# Patient Record
Sex: Male | Born: 1950
Health system: Southern US, Community
[De-identification: ages and names within clinical notes are randomized; demographics above are authoritative.]

## PROBLEM LIST (undated history)

## (undated) DIAGNOSIS — E041 Nontoxic single thyroid nodule: Secondary | ICD-10-CM

## (undated) DIAGNOSIS — K219 Gastro-esophageal reflux disease without esophagitis: Secondary | ICD-10-CM

## (undated) HISTORY — DX: Nontoxic single thyroid nodule: E04.1

## (undated) HISTORY — DX: Gastro-esophageal reflux disease without esophagitis: K21.9

---

## 2002-02-27 ENCOUNTER — Encounter: Payer: Self-pay | Admitting: Internal Medicine

## 2004-03-04 ENCOUNTER — Ambulatory Visit: Payer: Self-pay | Admitting: Internal Medicine

## 2005-07-25 DIAGNOSIS — R7303 Prediabetes: Secondary | ICD-10-CM

## 2005-08-09 ENCOUNTER — Ambulatory Visit: Payer: Self-pay | Admitting: Internal Medicine

## 2005-08-10 ENCOUNTER — Ambulatory Visit: Payer: Self-pay | Admitting: Internal Medicine

## 2005-10-12 ENCOUNTER — Ambulatory Visit: Payer: Self-pay | Admitting: Family Medicine

## 2006-06-18 ENCOUNTER — Other Ambulatory Visit: Payer: Self-pay

## 2006-06-18 ENCOUNTER — Emergency Department: Payer: Self-pay | Admitting: Emergency Medicine

## 2006-06-20 ENCOUNTER — Ambulatory Visit: Payer: Self-pay | Admitting: Internal Medicine

## 2007-02-03 ENCOUNTER — Telehealth: Payer: Self-pay | Admitting: Family Medicine

## 2007-03-03 ENCOUNTER — Ambulatory Visit: Payer: Self-pay | Admitting: Internal Medicine

## 2007-04-03 ENCOUNTER — Encounter: Payer: Self-pay | Admitting: Internal Medicine

## 2007-04-03 DIAGNOSIS — K449 Diaphragmatic hernia without obstruction or gangrene: Secondary | ICD-10-CM | POA: Insufficient documentation

## 2007-04-03 DIAGNOSIS — E785 Hyperlipidemia, unspecified: Secondary | ICD-10-CM | POA: Insufficient documentation

## 2007-04-13 ENCOUNTER — Ambulatory Visit: Payer: Self-pay | Admitting: Internal Medicine

## 2007-04-14 LAB — CONVERTED CEMR LAB
Cholesterol: 251 mg/dL (ref 0–200)
HDL: 41.1 mg/dL (ref >39.0)
Total CHOL/HDL Ratio: 6.1
Triglycerides: 183 mg/dL — ABNORMAL HIGH (ref 0–149)

## 2007-05-08 ENCOUNTER — Telehealth (INDEPENDENT_AMBULATORY_CARE_PROVIDER_SITE_OTHER): Payer: Self-pay | Admitting: *Deleted

## 2008-01-31 ENCOUNTER — Encounter (INDEPENDENT_AMBULATORY_CARE_PROVIDER_SITE_OTHER): Payer: Self-pay | Admitting: *Deleted

## 2008-06-24 ENCOUNTER — Ambulatory Visit: Payer: Self-pay | Admitting: Internal Medicine

## 2008-09-26 ENCOUNTER — Ambulatory Visit: Payer: Self-pay | Admitting: Internal Medicine

## 2008-09-27 LAB — CONVERTED CEMR LAB: PSA: 2.55 ng/mL (ref 0.10–4.00)

## 2008-09-30 LAB — CONVERTED CEMR LAB
Albumin: 4.4 g/dL (ref 3.5–5.2)
CO2: 30 meq/L (ref 19–32)
Calcium: 9.4 mg/dL (ref 8.4–10.5)
Chloride: 105 meq/L (ref 96–112)
Direct LDL: 162.3 mg/dL
HDL: 40.5 mg/dL (ref >39.00)
Potassium: 3.8 meq/L (ref 3.5–5.1)
TSH: 0.59 microintl units/mL (ref 0.35–5.50)
Total Bilirubin: 1.2 mg/dL (ref 0.3–1.2)
Total CHOL/HDL Ratio: 6
Triglycerides: 208 mg/dL — ABNORMAL HIGH (ref 0.0–149.0)

## 2008-10-01 ENCOUNTER — Ambulatory Visit: Payer: Self-pay | Admitting: Internal Medicine

## 2010-05-24 LAB — CONVERTED CEMR LAB: PSA: 1.54 ng/mL

## 2011-04-27 HISTORY — PX: CHOLECYSTECTOMY: SHX55

## 2011-11-05 ENCOUNTER — Ambulatory Visit: Payer: Self-pay | Admitting: Surgery

## 2012-04-03 ENCOUNTER — Ambulatory Visit: Payer: Self-pay | Admitting: Gastroenterology

## 2013-02-22 ENCOUNTER — Emergency Department: Payer: Self-pay | Admitting: Emergency Medicine

## 2013-02-22 LAB — SEDIMENTATION RATE: Erythrocyte Sed Rate: 2 mm/hr (ref 0–20)

## 2013-02-22 LAB — COMPREHENSIVE METABOLIC PANEL
Albumin: 4.1 g/dL (ref 3.4–5.0)
Alkaline Phosphatase: 121 U/L (ref 50–136)
Calcium, Total: 9.5 mg/dL (ref 8.5–10.1)
Chloride: 105 mmol/L (ref 98–107)
Co2: 29 mmol/L (ref 21–32)
Creatinine: 0.95 mg/dL (ref 0.60–1.30)
EGFR (African American): >60
SGOT(AST): 21 U/L (ref 15–37)
Sodium: 138 mmol/L (ref 136–145)
Total Protein: 7.6 g/dL (ref 6.4–8.2)

## 2013-02-22 LAB — CBC
HGB: 15.7 g/dL (ref 13.0–18.0)
MCH: 30.4 pg (ref 26.0–34.0)
MCHC: 34.5 g/dL (ref 32.0–36.0)
Platelet: 293 10*3/uL (ref 150–440)

## 2013-02-26 ENCOUNTER — Ambulatory Visit: Payer: Self-pay | Admitting: Ophthalmology

## 2013-05-31 DIAGNOSIS — M5430 Sciatica, unspecified side: Secondary | ICD-10-CM | POA: Diagnosis not present

## 2013-05-31 DIAGNOSIS — IMO0002 Reserved for concepts with insufficient information to code with codable children: Secondary | ICD-10-CM | POA: Diagnosis not present

## 2013-05-31 DIAGNOSIS — M999 Biomechanical lesion, unspecified: Secondary | ICD-10-CM | POA: Diagnosis not present

## 2013-05-31 DIAGNOSIS — M549 Dorsalgia, unspecified: Secondary | ICD-10-CM | POA: Diagnosis not present

## 2013-05-31 DIAGNOSIS — M955 Acquired deformity of pelvis: Secondary | ICD-10-CM | POA: Diagnosis not present

## 2013-05-31 DIAGNOSIS — M62838 Other muscle spasm: Secondary | ICD-10-CM | POA: Diagnosis not present

## 2013-07-26 DIAGNOSIS — M5137 Other intervertebral disc degeneration, lumbosacral region: Secondary | ICD-10-CM | POA: Diagnosis not present

## 2013-07-26 DIAGNOSIS — M999 Biomechanical lesion, unspecified: Secondary | ICD-10-CM | POA: Diagnosis not present

## 2013-07-26 DIAGNOSIS — M9981 Other biomechanical lesions of cervical region: Secondary | ICD-10-CM | POA: Diagnosis not present

## 2013-07-26 DIAGNOSIS — M530 Cervicocranial syndrome: Secondary | ICD-10-CM | POA: Diagnosis not present

## 2013-07-26 DIAGNOSIS — IMO0002 Reserved for concepts with insufficient information to code with codable children: Secondary | ICD-10-CM | POA: Diagnosis not present

## 2013-09-27 DIAGNOSIS — M5137 Other intervertebral disc degeneration, lumbosacral region: Secondary | ICD-10-CM | POA: Diagnosis not present

## 2013-09-27 DIAGNOSIS — M530 Cervicocranial syndrome: Secondary | ICD-10-CM | POA: Diagnosis not present

## 2013-09-27 DIAGNOSIS — M999 Biomechanical lesion, unspecified: Secondary | ICD-10-CM | POA: Diagnosis not present

## 2013-09-27 DIAGNOSIS — IMO0002 Reserved for concepts with insufficient information to code with codable children: Secondary | ICD-10-CM | POA: Diagnosis not present

## 2013-09-27 DIAGNOSIS — M9981 Other biomechanical lesions of cervical region: Secondary | ICD-10-CM | POA: Diagnosis not present

## 2013-10-03 DIAGNOSIS — C4441 Basal cell carcinoma of skin of scalp and neck: Secondary | ICD-10-CM | POA: Diagnosis not present

## 2013-10-22 DIAGNOSIS — L821 Other seborrheic keratosis: Secondary | ICD-10-CM | POA: Diagnosis not present

## 2013-10-22 DIAGNOSIS — D485 Neoplasm of uncertain behavior of skin: Secondary | ICD-10-CM | POA: Diagnosis not present

## 2013-10-22 DIAGNOSIS — L570 Actinic keratosis: Secondary | ICD-10-CM | POA: Diagnosis not present

## 2013-10-22 DIAGNOSIS — C4441 Basal cell carcinoma of skin of scalp and neck: Secondary | ICD-10-CM | POA: Diagnosis not present

## 2013-11-29 DIAGNOSIS — IMO0002 Reserved for concepts with insufficient information to code with codable children: Secondary | ICD-10-CM | POA: Diagnosis not present

## 2013-11-29 DIAGNOSIS — M999 Biomechanical lesion, unspecified: Secondary | ICD-10-CM | POA: Diagnosis not present

## 2013-11-29 DIAGNOSIS — M9981 Other biomechanical lesions of cervical region: Secondary | ICD-10-CM | POA: Diagnosis not present

## 2013-11-29 DIAGNOSIS — M530 Cervicocranial syndrome: Secondary | ICD-10-CM | POA: Diagnosis not present

## 2013-11-29 DIAGNOSIS — M5137 Other intervertebral disc degeneration, lumbosacral region: Secondary | ICD-10-CM | POA: Diagnosis not present

## 2013-12-03 DIAGNOSIS — C4441 Basal cell carcinoma of skin of scalp and neck: Secondary | ICD-10-CM | POA: Diagnosis not present

## 2014-01-24 DIAGNOSIS — M9901 Segmental and somatic dysfunction of cervical region: Secondary | ICD-10-CM | POA: Diagnosis not present

## 2014-01-24 DIAGNOSIS — M5117 Intervertebral disc disorders with radiculopathy, lumbosacral region: Secondary | ICD-10-CM | POA: Diagnosis not present

## 2014-01-24 DIAGNOSIS — M530 Cervicocranial syndrome: Secondary | ICD-10-CM | POA: Diagnosis not present

## 2014-01-24 DIAGNOSIS — M9903 Segmental and somatic dysfunction of lumbar region: Secondary | ICD-10-CM | POA: Diagnosis not present

## 2014-01-24 DIAGNOSIS — M9905 Segmental and somatic dysfunction of pelvic region: Secondary | ICD-10-CM | POA: Diagnosis not present

## 2014-01-24 DIAGNOSIS — Z23 Encounter for immunization: Secondary | ICD-10-CM | POA: Diagnosis not present

## 2014-01-30 DIAGNOSIS — E041 Nontoxic single thyroid nodule: Secondary | ICD-10-CM | POA: Diagnosis not present

## 2014-02-06 DIAGNOSIS — E041 Nontoxic single thyroid nodule: Secondary | ICD-10-CM | POA: Diagnosis not present

## 2014-02-07 DIAGNOSIS — E041 Nontoxic single thyroid nodule: Secondary | ICD-10-CM | POA: Diagnosis not present

## 2014-03-28 DIAGNOSIS — M5117 Intervertebral disc disorders with radiculopathy, lumbosacral region: Secondary | ICD-10-CM | POA: Diagnosis not present

## 2014-03-28 DIAGNOSIS — M9903 Segmental and somatic dysfunction of lumbar region: Secondary | ICD-10-CM | POA: Diagnosis not present

## 2014-03-28 DIAGNOSIS — M9901 Segmental and somatic dysfunction of cervical region: Secondary | ICD-10-CM | POA: Diagnosis not present

## 2014-03-28 DIAGNOSIS — M9905 Segmental and somatic dysfunction of pelvic region: Secondary | ICD-10-CM | POA: Diagnosis not present

## 2014-03-28 DIAGNOSIS — M530 Cervicocranial syndrome: Secondary | ICD-10-CM | POA: Diagnosis not present

## 2014-04-02 DIAGNOSIS — B360 Pityriasis versicolor: Secondary | ICD-10-CM | POA: Diagnosis not present

## 2014-04-02 DIAGNOSIS — L821 Other seborrheic keratosis: Secondary | ICD-10-CM | POA: Diagnosis not present

## 2014-04-02 DIAGNOSIS — D225 Melanocytic nevi of trunk: Secondary | ICD-10-CM | POA: Diagnosis not present

## 2014-04-02 DIAGNOSIS — Z85828 Personal history of other malignant neoplasm of skin: Secondary | ICD-10-CM | POA: Diagnosis not present

## 2014-06-27 DIAGNOSIS — M9905 Segmental and somatic dysfunction of pelvic region: Secondary | ICD-10-CM | POA: Diagnosis not present

## 2014-06-27 DIAGNOSIS — M5117 Intervertebral disc disorders with radiculopathy, lumbosacral region: Secondary | ICD-10-CM | POA: Diagnosis not present

## 2014-06-27 DIAGNOSIS — M9901 Segmental and somatic dysfunction of cervical region: Secondary | ICD-10-CM | POA: Diagnosis not present

## 2014-06-27 DIAGNOSIS — M9903 Segmental and somatic dysfunction of lumbar region: Secondary | ICD-10-CM | POA: Diagnosis not present

## 2014-06-27 DIAGNOSIS — M530 Cervicocranial syndrome: Secondary | ICD-10-CM | POA: Diagnosis not present

## 2014-08-18 NOTE — Op Note (Signed)
PATIENT NAME:  Christopher Foster, FARRELLY MR#:  [id number] DATE OF BIRTH:  1950/11/06  DATE OF PROCEDURE:  11/05/2011  PREOPERATIVE DIAGNOSIS: Chronic cholecystitis, cholelithiasis.   POSTOPERATIVE DIAGNOSIS: Chronic cholecystitis, cholelithiasis.   PROCEDURE: Laparoscopic cholecystectomy, cholangiogram.   SURGEON: Rochel Brome, MD  ANESTHESIA: General.   INDICATIONS: This 64 year old male has had two episodes of epigastric pain and ultrasound findings of gallstones. Surgery was recommended for definitive treatment. He had had previous laparoscopy and open appendectomy.  DESCRIPTION OF PROCEDURE: The patient was placed on the operating table in the supine position under general endotracheal anesthesia. The abdomen was clipped and prepared with ChloraPrep and draped in a sterile manner.   A short incision was made in the inferior aspect of the umbilicus and carried down to the deep fascia which was grasped with laryngeal hook and elevated. A Veress needle was inserted, aspirated, and irrigated with a saline solution. The peritoneal cavity was insufflated with carbon dioxide. However, insufflation pressures were high and removed the Veress needle. Subsequently I inserted the 11 mm cannula with the laparoscope in the cannula to demonstrate the tissues on the video monitor as the cannula was advanced down through the fascia and into the peritoneal cavity with videoscopic visualization. Next, the peritoneal cavity was insufflated with carbon dioxide. There appeared to be some adhesions between the omentum and the lower abdominal wall. The liver appeared smooth. Another incision was made in the epigastrium just slightly to the right of the midline to introduce the 10 mm cannula. Two incisions were made in the lateral aspect of the right upper quadrant to introduce two 5 mm cannulas.   Laparoscope was briefly moved to the epigastric port to view the umbilical area. There were adhesions. There was some small bowel  adherent to the anterior abdominal wall just below the entrance point of the cannula.   The laparoscope was moved back to the umbilical port. The patient was placed in the reverse Trendelenburg position and turned some 5 degrees to the left. The gallbladder was found to have a slightly thickened wall and was retracted towards the right shoulder. Multiple adhesions were taken down which were between the gallbladder and the liver. The pouch of Althea Grimmer was mobilized with incision of the visceral peritoneum. The porta hepatis was demonstrated. There was a large amount of fatty material surrounding the cystic duct. This fatty tissue was incised and was bluntly retracted and dissected back away from the cystic duct. Circumferential dissection was carried out around the cystic duct. The cystic artery was dissected free from surrounding structures. The gallbladder neck was further mobilized. A critical view of safety was demonstrated. An endoclip was placed across the cystic duct adjacent to the neck of the gallbladder. An incision was made in the cystic duct to introduce a Reddick catheter. Half-strength Conray-60 dye was injected as the cholangiogram was done with fluoroscopy viewing the biliary tree and prompt flow of dye into the duodenum. No retained stones were seen. The Reddick catheter was removed. The cystic duct was doubly ligated with endoclips and divided. The cystic artery was controlled with double endoclips and divided. One other small branch of the cystic artery was controlled with a single endoclip and divided. The gallbladder was dissected free from the liver with hook and cautery and also blunt and sharp dissection. The gallbladder was completely separated and was brought up through the infraumbilical incision, opened, suctioned, removed, and sent with small palpable stones for routine pathology. The umbilical site was further viewed with the  laparoscope and saw no evidence of any leakage where the  small bowel was tethered to the anterior abdominal wall below the entrance point. Next, the right upper quadrant was further inspected. Hemostasis was intact. Next, the cannulas were removed seeing no bleeding from the cannula sites. The incisions were closed with interrupted 5-0 chromic subcuticular sutures, benzoin, and Steri-Strips. Dressings were applied with paper tape. The patient tolerated surgery satisfactorily and was then moved to the recovery room for postoperative care. ____________________________ J. Rochel Brome, MD jws:slb D: 11/05/2011 11:11:10 ET T: 11/05/2011 11:38:56 ET JOB#: [id number]  cc: Loreli Dollar, MD, <Dictator> Loreli Dollar MD ELECTRONICALLY SIGNED 11/07/2011 22:18

## 2014-10-03 DIAGNOSIS — M9903 Segmental and somatic dysfunction of lumbar region: Secondary | ICD-10-CM | POA: Diagnosis not present

## 2014-10-03 DIAGNOSIS — M9901 Segmental and somatic dysfunction of cervical region: Secondary | ICD-10-CM | POA: Diagnosis not present

## 2014-10-03 DIAGNOSIS — M5117 Intervertebral disc disorders with radiculopathy, lumbosacral region: Secondary | ICD-10-CM | POA: Diagnosis not present

## 2014-10-03 DIAGNOSIS — M530 Cervicocranial syndrome: Secondary | ICD-10-CM | POA: Diagnosis not present

## 2014-10-03 DIAGNOSIS — M9905 Segmental and somatic dysfunction of pelvic region: Secondary | ICD-10-CM | POA: Diagnosis not present

## 2014-10-14 DIAGNOSIS — L570 Actinic keratosis: Secondary | ICD-10-CM | POA: Diagnosis not present

## 2014-10-14 DIAGNOSIS — L820 Inflamed seborrheic keratosis: Secondary | ICD-10-CM | POA: Diagnosis not present

## 2014-10-14 DIAGNOSIS — X32XXXA Exposure to sunlight, initial encounter: Secondary | ICD-10-CM | POA: Diagnosis not present

## 2014-10-14 DIAGNOSIS — B360 Pityriasis versicolor: Secondary | ICD-10-CM | POA: Diagnosis not present

## 2014-10-14 DIAGNOSIS — D485 Neoplasm of uncertain behavior of skin: Secondary | ICD-10-CM | POA: Diagnosis not present

## 2014-10-14 DIAGNOSIS — Z85828 Personal history of other malignant neoplasm of skin: Secondary | ICD-10-CM | POA: Diagnosis not present

## 2014-10-14 DIAGNOSIS — L280 Lichen simplex chronicus: Secondary | ICD-10-CM | POA: Diagnosis not present

## 2014-10-15 DIAGNOSIS — M5117 Intervertebral disc disorders with radiculopathy, lumbosacral region: Secondary | ICD-10-CM | POA: Diagnosis not present

## 2014-10-15 DIAGNOSIS — M9901 Segmental and somatic dysfunction of cervical region: Secondary | ICD-10-CM | POA: Diagnosis not present

## 2014-10-15 DIAGNOSIS — M530 Cervicocranial syndrome: Secondary | ICD-10-CM | POA: Diagnosis not present

## 2014-10-15 DIAGNOSIS — M9903 Segmental and somatic dysfunction of lumbar region: Secondary | ICD-10-CM | POA: Diagnosis not present

## 2014-10-15 DIAGNOSIS — M9905 Segmental and somatic dysfunction of pelvic region: Secondary | ICD-10-CM | POA: Diagnosis not present

## 2014-10-16 DIAGNOSIS — J069 Acute upper respiratory infection, unspecified: Secondary | ICD-10-CM | POA: Diagnosis not present

## 2014-10-17 DIAGNOSIS — M9905 Segmental and somatic dysfunction of pelvic region: Secondary | ICD-10-CM | POA: Diagnosis not present

## 2014-10-17 DIAGNOSIS — M530 Cervicocranial syndrome: Secondary | ICD-10-CM | POA: Diagnosis not present

## 2014-10-17 DIAGNOSIS — M5117 Intervertebral disc disorders with radiculopathy, lumbosacral region: Secondary | ICD-10-CM | POA: Diagnosis not present

## 2014-10-17 DIAGNOSIS — M9901 Segmental and somatic dysfunction of cervical region: Secondary | ICD-10-CM | POA: Diagnosis not present

## 2014-10-17 DIAGNOSIS — M9903 Segmental and somatic dysfunction of lumbar region: Secondary | ICD-10-CM | POA: Diagnosis not present

## 2014-10-29 DIAGNOSIS — M9903 Segmental and somatic dysfunction of lumbar region: Secondary | ICD-10-CM | POA: Diagnosis not present

## 2014-10-29 DIAGNOSIS — M530 Cervicocranial syndrome: Secondary | ICD-10-CM | POA: Diagnosis not present

## 2014-10-29 DIAGNOSIS — M9901 Segmental and somatic dysfunction of cervical region: Secondary | ICD-10-CM | POA: Diagnosis not present

## 2014-10-29 DIAGNOSIS — M5117 Intervertebral disc disorders with radiculopathy, lumbosacral region: Secondary | ICD-10-CM | POA: Diagnosis not present

## 2014-10-29 DIAGNOSIS — M9905 Segmental and somatic dysfunction of pelvic region: Secondary | ICD-10-CM | POA: Diagnosis not present

## 2015-01-09 DIAGNOSIS — M5117 Intervertebral disc disorders with radiculopathy, lumbosacral region: Secondary | ICD-10-CM | POA: Diagnosis not present

## 2015-01-09 DIAGNOSIS — M530 Cervicocranial syndrome: Secondary | ICD-10-CM | POA: Diagnosis not present

## 2015-01-09 DIAGNOSIS — M9905 Segmental and somatic dysfunction of pelvic region: Secondary | ICD-10-CM | POA: Diagnosis not present

## 2015-01-09 DIAGNOSIS — M9903 Segmental and somatic dysfunction of lumbar region: Secondary | ICD-10-CM | POA: Diagnosis not present

## 2015-01-09 DIAGNOSIS — M9901 Segmental and somatic dysfunction of cervical region: Secondary | ICD-10-CM | POA: Diagnosis not present

## 2015-01-24 DIAGNOSIS — E041 Nontoxic single thyroid nodule: Secondary | ICD-10-CM | POA: Diagnosis not present

## 2015-02-03 DIAGNOSIS — E041 Nontoxic single thyroid nodule: Secondary | ICD-10-CM | POA: Diagnosis not present

## 2015-02-07 DIAGNOSIS — Z23 Encounter for immunization: Secondary | ICD-10-CM | POA: Diagnosis not present

## 2015-02-20 ENCOUNTER — Ambulatory Visit (INDEPENDENT_AMBULATORY_CARE_PROVIDER_SITE_OTHER): Payer: Medicare Other | Admitting: Family Medicine

## 2015-02-20 ENCOUNTER — Telehealth: Payer: Self-pay | Admitting: Family Medicine

## 2015-02-20 ENCOUNTER — Encounter: Payer: Self-pay | Admitting: Family Medicine

## 2015-02-20 VITALS — BP 112/68 | HR 68 | Temp 98.2°F | Ht 70.08 in | Wt 235.4 lb

## 2015-02-20 DIAGNOSIS — Z85850 Personal history of malignant neoplasm of thyroid: Secondary | ICD-10-CM | POA: Insufficient documentation

## 2015-02-20 DIAGNOSIS — E041 Nontoxic single thyroid nodule: Secondary | ICD-10-CM | POA: Diagnosis not present

## 2015-02-20 DIAGNOSIS — E669 Obesity, unspecified: Secondary | ICD-10-CM | POA: Diagnosis not present

## 2015-02-20 LAB — COMPREHENSIVE METABOLIC PANEL
ALT: 34 U/L (ref 0–53)
AST: 28 U/L (ref 0–37)
Albumin: 4.7 g/dL (ref 3.5–5.2)
Alkaline Phosphatase: 93 U/L (ref 39–117)
BUN: 19 mg/dL (ref 6–23)
CHLORIDE: 103 meq/L (ref 96–112)
CO2: 31 meq/L (ref 19–32)
Calcium: 10.1 mg/dL (ref 8.4–10.5)
Creatinine, Ser: 0.97 mg/dL (ref 0.40–1.50)
GFR: 82.17 mL/min (ref >60.00)
Glucose, Bld: 109 mg/dL — ABNORMAL HIGH (ref 70–99)
Potassium: 5.3 mEq/L — ABNORMAL HIGH (ref 3.5–5.1)
SODIUM: 140 meq/L (ref 135–145)
Total Bilirubin: 0.6 mg/dL (ref 0.2–1.2)
Total Protein: 7.7 g/dL (ref 6.0–8.3)

## 2015-02-20 LAB — LIPID PANEL
Cholesterol: 237 mg/dL — ABNORMAL HIGH (ref 0–200)
HDL: 46.7 mg/dL (ref >39.00)
LDL CALC: 165 mg/dL — AB (ref 0–99)
NonHDL: 190.73
Total CHOL/HDL Ratio: 5
Triglycerides: 131 mg/dL (ref 0.0–149.0)
VLDL: 26.2 mg/dL (ref 0.0–40.0)

## 2015-02-20 NOTE — Assessment & Plan Note (Signed)
Patient with 7 cm thyroid nodule. Followed by endocrinology for this. Reports recent biopsy revealed noncancerous material. Recent TSH in normal range. Patient will continue to follow with endocrinology for this.

## 2015-02-20 NOTE — Patient Instructions (Signed)
Nice to meet you. Please continue to work on exercise by walking daily. Please continue with the diet changes that you have already made. We will obtain lab work and call or send a letter with the results.

## 2015-02-20 NOTE — Telephone Encounter (Signed)
Called patient to discuss lab work. Advised him of elevated cholesterol and that he would benefit from cholesterol medication to decrease his cholesterol and risk for cardiac events. Also discussed diet and exercise is an option. Advised that medication be most beneficial though if he wanted we could do diet and exercise for a period of time to see if this helped. Patient opted for diet and exercise at this time. Also informed of minimally elevated potassium at 5.3. He denies any palpitations or chest pain or trouble breathing. Discussed that I would like to recheck this in the next week, though patient opted to have this rechecked at a longer interval of a couple of months when he comes back for repeat cholesterol check. I advised him of return precautions.

## 2015-02-20 NOTE — Assessment & Plan Note (Addendum)
Patient appears to be in relatively good health at this time. He is obese with a BMI of 33. We discussed diet and exercise. He has recently made changes with regards to his diet and exercise. He will continue these changes. His blood pressure is the normal range. We discussed hepatitis C screening and he declined this. We discussed prostate cancer screening with PSA and he declined this. He is up-to-date on his colonoscopy per his report. He has had a flu shot this year. He reports that he is up-to-date on his pneumonia vaccines. He has had Zostavax already. We will check screening lipid panel and CMP. We will request records from his prior PCP and colonoscopy records.

## 2015-02-20 NOTE — Progress Notes (Signed)
Patient ID: Christopher Foster, male   DOB: [birth year], 64 y.o.   MRN: [id number]  Christopher Rumps, MD Phone: [phone]  Christopher Foster is a 64 y.o. male who presents today for new patient visit.  Patient reports no complaints at this time. States he was previously seen at [facility] clinic, though has not been seen there in greater than 2 years.  Patient notes his diet consists mostly of poultry and fish as protein sources. He eats a significant amount of green vegetables. He drinks 1-2 sodas a week. He does drink sweet tea though they use artificial sweetener with this. He does not eat fast food. His exercise consists of walking a couple miles a day. 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 He notes he has increased his exercise and changed his diet in the last month. He also plays golf. He states his last colonoscopy was in 2013 and was normal. His Tdap is up-to-date. He has completed the Zostavax vaccine. He states he has had a flu shot this year. Patient states he has had the pneumonia vaccine. States he has had his PSA checked previously and this was normal. No family history of prostate cancer. He states he would like to stop checking for prostate cancer this time. He has never smoked or used tobacco products. He does drink alcohol with one drink per night. He does not use any illicit drugs at this time.  Thyroid nodule: patient does report that he has a thyroid mass that is being followed by endocrinology. He saw them recently. He had a biopsy about 6 months ago and this was noncancerous. Notes the lesion is 7 cm. States they're going to continue to follow this. Has no issues swallowing.  Active Ambulatory Problems    Diagnosis Date Noted  . HYPERLIPIDEMIA 04/03/2007  . HIATAL HERNIA 04/03/2007  . IMPAIRED FASTING GLUCOSE 07/25/2005  . Obesity (BMI 30-39.9) 02/20/2015  . Thyroid nodule 02/20/2015   Resolved Ambulatory Problems    Diagnosis Date Noted  . Obesity 02/20/2015    Past Medical History  Diagnosis Date  . GERD (gastroesophageal reflux disease)     Family History  Problem Relation Age of Onset  . Renal cancer Father   . Renal cancer Brother   . Throat cancer Brother     Social History   Social History  . Marital Status: Married    Spouse Name: N/A  . Number of Children: N/A  . Years of Education: N/A   Occupational History  . Not on file.   Social History Main Topics  . Smoking status: Never Smoker   . Smokeless tobacco: Not on file  . Alcohol Use: 4.2 oz/week    7 Standard drinks or equivalent per week  . Drug Use: No  . Sexual Activity: Not on file   Other Topics Concern  . Not on file   Social History Narrative  . No narrative on file    ROS   General:  Negative for unexplained weight loss, fever Skin: Negative for new or changing mole, sore that won't heal HEENT: Negative for trouble hearing, trouble seeing, ringing in ears, mouth sores, hoarseness, change in voice, dysphagia. CV:  Negative for chest pain, dyspnea, edema, palpitations Resp: Negative for cough, dyspnea, hemoptysis GI: Negative for nausea, vomiting, diarrhea, constipation, abdominal pain, melena, hematochezia. GU: Negative for dysuria, incontinence, urinary hesitance, hematuria, vaginal or penile discharge, polyuria, sexual difficulty, lumps in testicle or breasts MSK: Negative for muscle cramps or aches,  joint pain or swelling Neuro: Negative for headaches, weakness, numbness, dizziness, passing out/fainting Psych: Negative for depression, anxiety, memory problems  Objective  Physical Exam Filed Vitals:   02/20/15 0915  BP: 112/68  Pulse: 68  Temp: 98.2 F (36.8 C)    Physical Exam  Constitutional: He is well-developed, well-nourished, and in no distress.  HENT:  Head: Normocephalic and atraumatic.  Right Ear: External ear normal.  Left Ear: External ear normal.  Mouth/Throat: Oropharynx is clear and moist. No oropharyngeal exudate.    Normal TMs bilaterally  Eyes: Conjunctivae are normal. Pupils are equal, round, and reactive to light.  Neck: Neck supple.  Enlargement of thyroid noted  Cardiovascular: Normal rate, regular rhythm and normal heart sounds.  Exam reveals no gallop and no friction rub.   No murmur heard. Pulmonary/Chest: Effort normal and breath sounds normal. No respiratory distress. He has no wheezes. He has no rales.  Abdominal: Soft. Bowel sounds are normal. He exhibits no distension. There is no tenderness. There is no rebound and no guarding.  Musculoskeletal: He exhibits no edema.  Lymphadenopathy:    He has no cervical adenopathy.  Neurological: He is alert. Gait normal.  Skin: Skin is warm and dry. He is not diaphoretic.  Psychiatric: Mood and affect normal.     Assessment/Plan:   Obesity (BMI 30-39.9) Patient appears to be in relatively good health at this time. He is obese with a BMI of 33. We discussed diet and exercise. He has recently made changes with regards to his diet and exercise. He will continue these changes. His blood pressure is the normal range. We discussed hepatitis C screening and he declined this. We discussed prostate cancer screening with PSA and he declined this. He is up-to-date on his colonoscopy per his report. He has had a flu shot this year. He reports that he is up-to-date on his pneumonia vaccines. He has had Zostavax already. We will check screening lipid panel and CMP. We will request records from his prior PCP and colonoscopy records.  Thyroid nodule Patient with 7 cm thyroid nodule. Followed by endocrinology for this. Reports recent biopsy revealed noncancerous material. Recent TSH in normal range. Patient will continue to follow with endocrinology for this.    Orders Placed This Encounter  Procedures  . Comp Met (CMET)  . Lipid Profile    Christopher Foster

## 2015-02-20 NOTE — Progress Notes (Signed)
Pre visit review using our clinic review tool, if applicable. No additional management support is needed unless otherwise documented below in the visit note. 

## 2015-03-27 DIAGNOSIS — M9905 Segmental and somatic dysfunction of pelvic region: Secondary | ICD-10-CM | POA: Diagnosis not present

## 2015-03-27 DIAGNOSIS — M5117 Intervertebral disc disorders with radiculopathy, lumbosacral region: Secondary | ICD-10-CM | POA: Diagnosis not present

## 2015-03-27 DIAGNOSIS — M9903 Segmental and somatic dysfunction of lumbar region: Secondary | ICD-10-CM | POA: Diagnosis not present

## 2015-03-27 DIAGNOSIS — M9901 Segmental and somatic dysfunction of cervical region: Secondary | ICD-10-CM | POA: Diagnosis not present

## 2015-03-27 DIAGNOSIS — M530 Cervicocranial syndrome: Secondary | ICD-10-CM | POA: Diagnosis not present

## 2015-04-17 ENCOUNTER — Telehealth: Payer: Self-pay | Admitting: Family Medicine

## 2015-04-17 NOTE — Telephone Encounter (Signed)
Pt lvm stating that he has found a cyst on his left testicle. Wants to know if dr. Caryl Bis needs to see this or if he can go ahead and refer him to a urologist. Pt would like call back. Please advise

## 2015-04-17 NOTE — Telephone Encounter (Signed)
Please advise 

## 2015-04-17 NOTE — Telephone Encounter (Signed)
Patient needs to see PCP for this.

## 2015-04-17 NOTE — Telephone Encounter (Signed)
Called and scheduled patient for appointment with Dr. Caryl Bis on Tuesday.

## 2015-04-22 ENCOUNTER — Ambulatory Visit (INDEPENDENT_AMBULATORY_CARE_PROVIDER_SITE_OTHER): Payer: Medicare Other | Admitting: Family Medicine

## 2015-04-22 ENCOUNTER — Encounter: Payer: Self-pay | Admitting: Family Medicine

## 2015-04-22 VITALS — BP 120/72 | HR 65 | Temp 97.6°F | Wt 234.8 lb

## 2015-04-22 DIAGNOSIS — R7309 Other abnormal glucose: Secondary | ICD-10-CM | POA: Diagnosis not present

## 2015-04-22 DIAGNOSIS — R7301 Impaired fasting glucose: Secondary | ICD-10-CM

## 2015-04-22 DIAGNOSIS — N509 Disorder of male genital organs, unspecified: Secondary | ICD-10-CM

## 2015-04-22 DIAGNOSIS — E785 Hyperlipidemia, unspecified: Secondary | ICD-10-CM

## 2015-04-22 NOTE — Progress Notes (Signed)
Patient ID: [name], male   DOB: 1951/01/24, 64 y.o.   MRN: [id number]  [name], MD Phone: [phone]  Christopher Foster is a 64 y.o. male who presents today for f/u.  HYPERLIPIDEMIA Symptoms Chest pain on exertion:  no   Leg claudication:   No    shortness of breath: No Patient not currently on any medicine. He declined this opting to work on diet and exercise. Notes he has lost 5 pounds. He walks 2 miles a day. He is watching what he eats.  Elevated glucose: Noted on last lab work. He has no history of diabetes. No polyuria or polydipsia. No family history of diabetes. No prior A1c checked. Has been working on diet and exercise.  Testicular lesion: Patient notes since her last visit he has noted a small nodule on the inferior pole of his left testicle. It is not painful. Has not grown since he noticed it. He denies any back pain or dysuria. No fevers. Possibly notes some warmth, though he is unsure of this. No erythema of the scrotum.  PMH: nonsmoker.   ROS see history of present illness  Objective  Physical Exam Filed Vitals:   04/22/15 1008  BP: 120/72  Pulse: 65  Temp: 97.6 F (36.4 C)    Physical Exam  Constitutional: He is well-developed, well-nourished, and in no distress.  HENT:  Head: Normocephalic and atraumatic.  Mouth/Throat: Oropharynx is clear and moist. No oropharyngeal exudate.  Cardiovascular: Normal rate, regular rhythm and normal heart sounds.  Exam reveals no gallop and no friction rub.   No murmur heard. Pulmonary/Chest: Effort normal and breath sounds normal. No respiratory distress. He has no wheezes. He has no rales.  Genitourinary:  Normal circumcised penis, left testicle with small soft feeling mass at the inferior pole, this is not tender, this is not warm, there is no scrotal erythema, there is no abdominal tenderness or swelling, there are no other masses felt testicle, right testicle is normal with no swelling or tenderness or warmth or  erythema, no inguinal hernias  Musculoskeletal: He exhibits no edema.  Neurological: He is alert. Gait normal.  Skin: Skin is warm and dry. He is not diaphoretic.     Assessment/Plan: Please see individual problem list.  Hyperlipidemia Noted on prior blood work. Patient is asymptomatic. He is working on diet and exercise as he does not want medication for this. He does not want to do lab work today thus he will return in several weeks to recheck an LDL.  IMPAIRED FASTING GLUCOSE Patient with elevated glucose on last CMP. Asymptomatic at this time. No history of diabetes. We will check an A1c and repeat a CMP when he returns for lab work.  Testicular lesion Lesion on inferior pole of left testicle could be normal epididymal tissue, though given that he has not noticed this previously we will obtain an ultrasound to evaluate this further. No firm lesions noted. Otherwise GU exam normal. If abnormal we will refer to urology in [place]. Even return precautions.    Orders Placed This Encounter  Procedures  . US Scrotum    Standing Status: Future     Number of Occurrences:      Standing Expiration Date: 06/22/2016    Order Specific Question:  Reason for Exam (SYMPTOM  OR DIAGNOSIS REQUIRED)    Answer:  testicular mass, soft, not growing    Order Specific Question:  Preferred imaging location?    Answer:  [facility]  . Direct  LDL    Standing Status: Future     Number of Occurrences:      Standing Expiration Date: 04/21/2016  . Comp Met (CMET)    Standing Status: Future     Number of Occurrences:      Standing Expiration Date: 04/21/2016  . HgB A1c    Standing Status: Future     Number of Occurrences:      Standing Expiration Date: 04/21/2016    Dragon voice recognition software was used during the dictation process of this note. If any phrases or words seem inappropriate it is likely secondary to the translation process being inefficient.  [name]

## 2015-04-22 NOTE — Assessment & Plan Note (Signed)
Lesion on inferior pole of left testicle could be normal epididymal tissue, though given that he has not noticed this previously we will obtain an ultrasound to evaluate this further. No firm lesions noted. Otherwise GU exam normal. If abnormal we will refer to urology in [place]. Even return precautions.

## 2015-04-22 NOTE — Progress Notes (Signed)
Pre visit review using our clinic review tool, if applicable. No additional management support is needed unless otherwise documented below in the visit note. 

## 2015-04-22 NOTE — Assessment & Plan Note (Signed)
Noted on prior blood work. Patient is asymptomatic. He is working on diet and exercise as he does not want medication for this. He does not want to do lab work today thus he will return in several weeks to recheck an LDL.

## 2015-04-22 NOTE — Assessment & Plan Note (Signed)
Patient with elevated glucose on last CMP. Asymptomatic at this time. No history of diabetes. We will check an A1c and repeat a CMP when he returns for lab work.

## 2015-04-22 NOTE — Patient Instructions (Signed)
Nice to see you. We will obtain an ultrasound of your testicles to evaluate the lesion. If it grows or becomes painful or red or you develop burning with urination or back pain please let us know. Please return in the middle of January or the end of January for lab work.

## 2015-04-24 ENCOUNTER — Other Ambulatory Visit: Payer: Self-pay | Admitting: Family Medicine

## 2015-04-24 DIAGNOSIS — N509 Disorder of male genital organs, unspecified: Secondary | ICD-10-CM

## 2015-04-29 ENCOUNTER — Ambulatory Visit
Admission: RE | Admit: 2015-04-29 | Discharge: 2015-04-29 | Disposition: A | Discharge status: Home / Self Care | Payer: Medicare Other | Source: Ambulatory Visit | Attending: Family Medicine | Admitting: Family Medicine

## 2015-04-29 DIAGNOSIS — N509 Disorder of male genital organs, unspecified: Secondary | ICD-10-CM | POA: Insufficient documentation

## 2015-04-29 DIAGNOSIS — N503 Cyst of epididymis: Secondary | ICD-10-CM | POA: Diagnosis not present

## 2015-06-08 IMAGING — CT CT HEAD WITHOUT CONTRAST
1 series · 16 of 30 positions shown, 20 images · non-contrast
Comparison: none

REASON FOR EXAM: HA
COMMENTS:   May transport without cardiac monitor

PROCEDURE:     CT  - CT HEAD WITHOUT CONTRAST  - February 22, 2013  [DATE]
RESULT:     History: Headache.
Comparison Study: Head CT 06/18/2006.

[Series 2: head wo · axial · 0.43mm/px · z∈[-19,+131]mm · 16 of 34 slices shown, 20 images]
[im 2/34  brain]
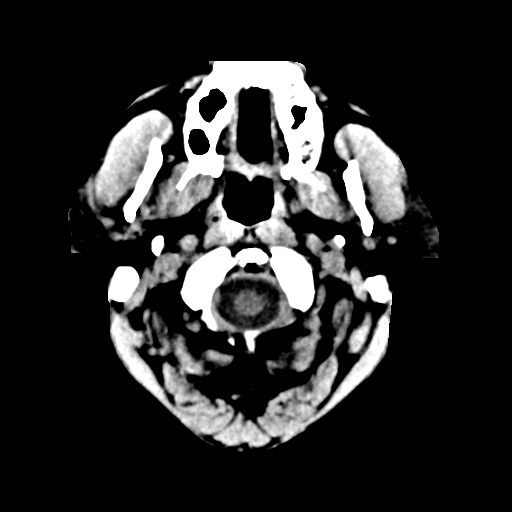
[im 2/34  bone]
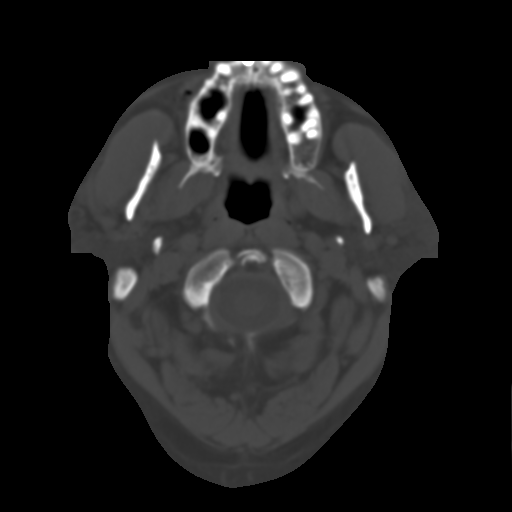
[im 4/34  brain]
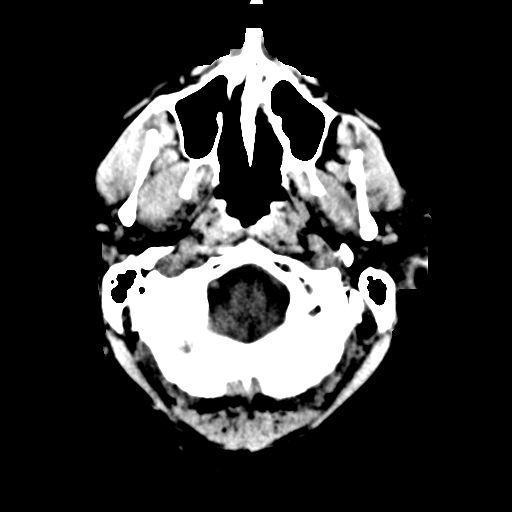
[im 6/34  brain]
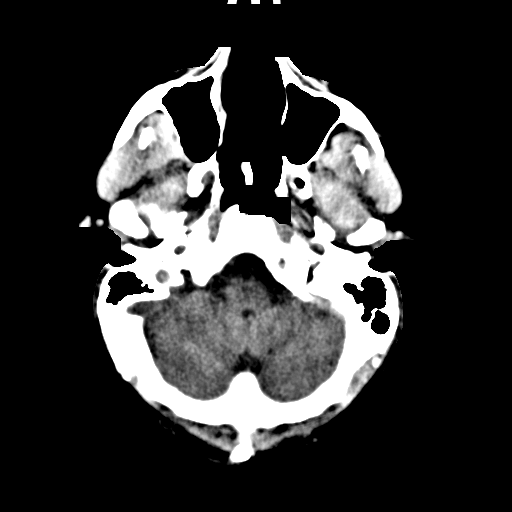
[im 8/34  brain]
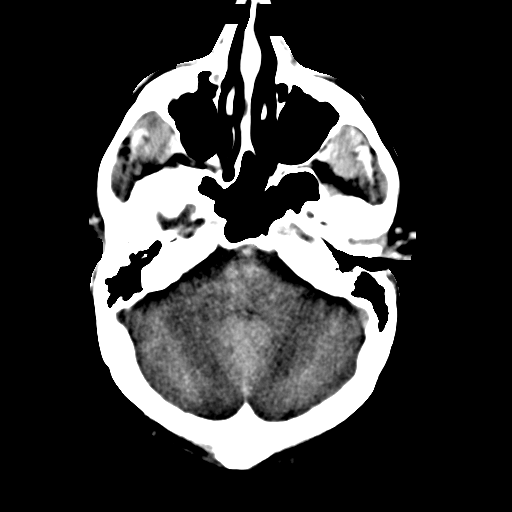
[im 10/34  brain]
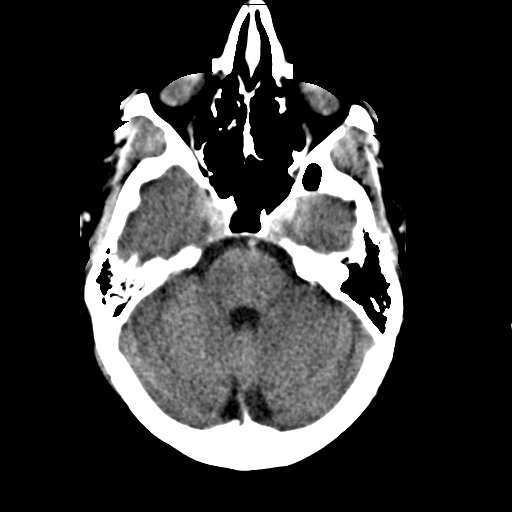
[im 10/34  bone]
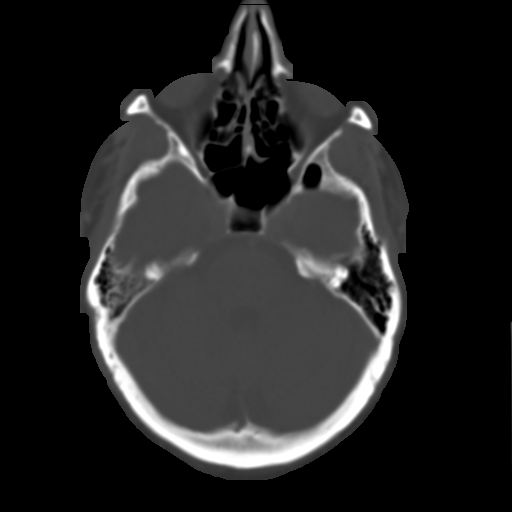
[im 12/34  brain]
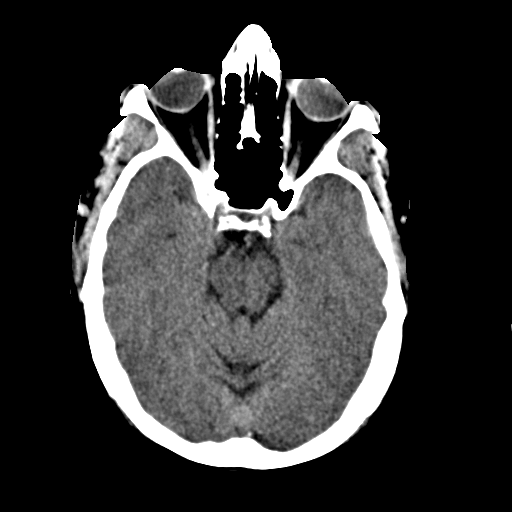
[im 14/34  brain]
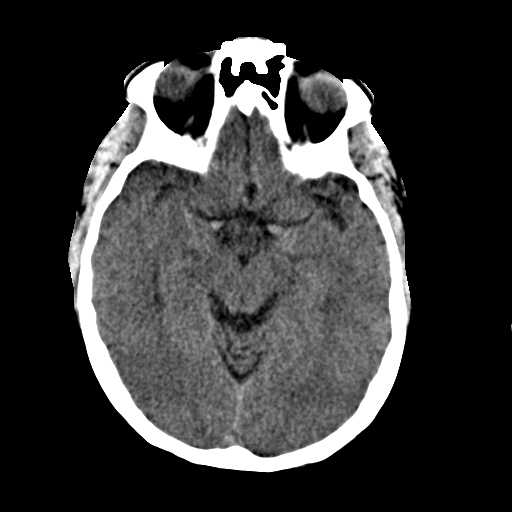
[im 16/34  brain]
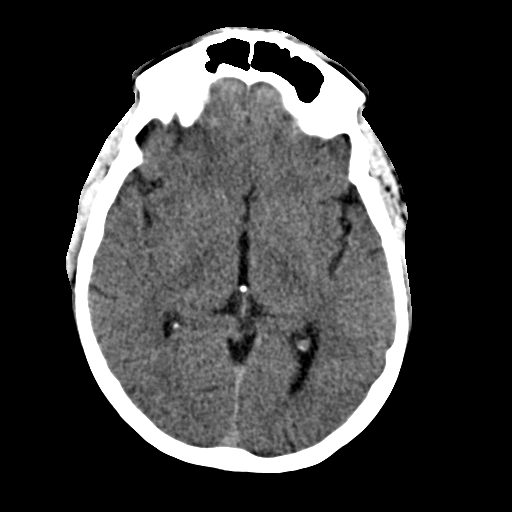
[im 18/34  brain]
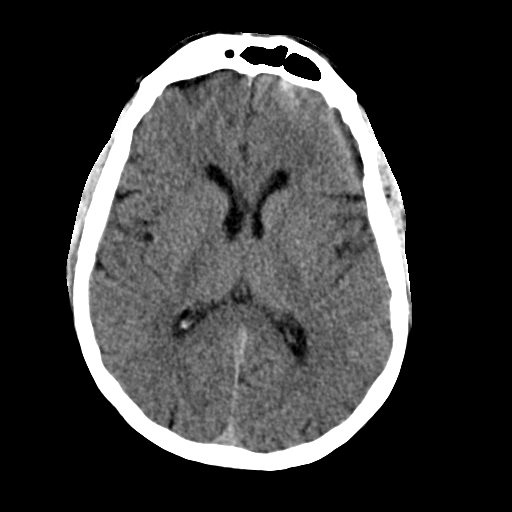
[im 18/34  bone]
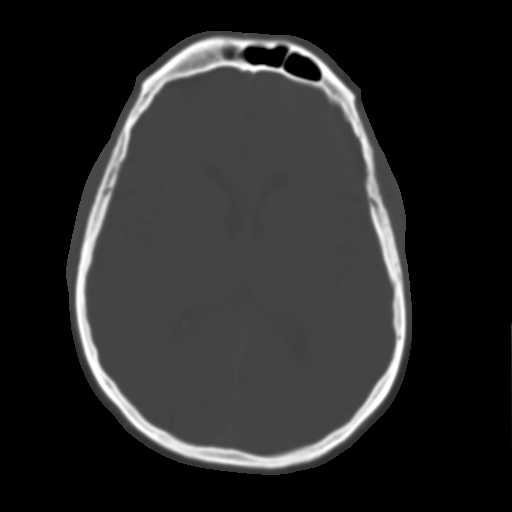
[im 20/34  brain]
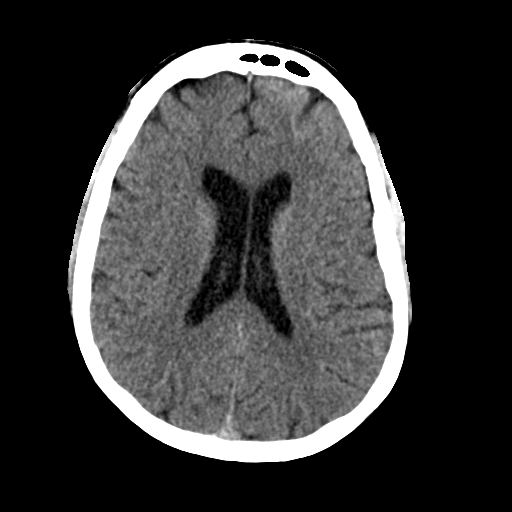
[im 22/34  brain]
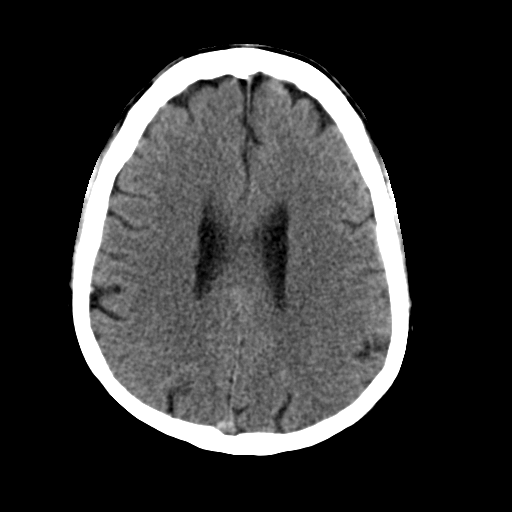
[im 24/34  brain]
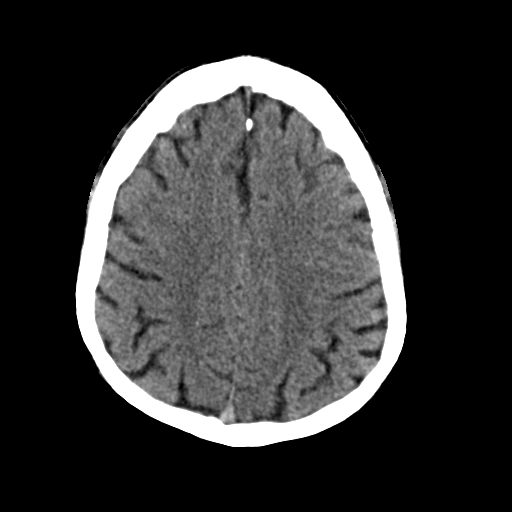
[im 26/34  brain]
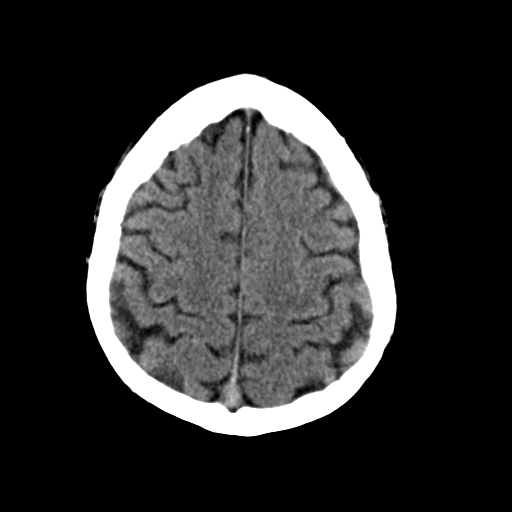
[im 26/34  bone]
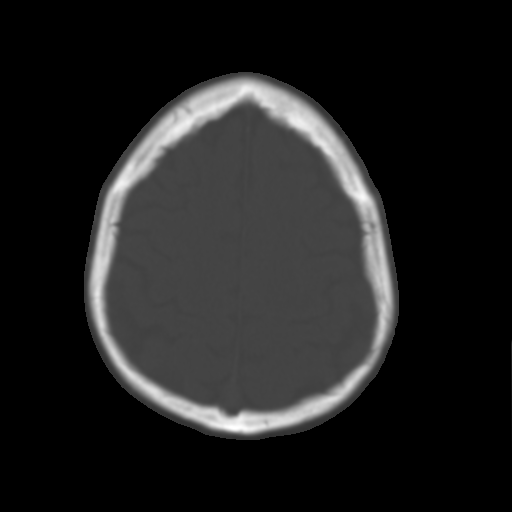
[im 28/34  brain]
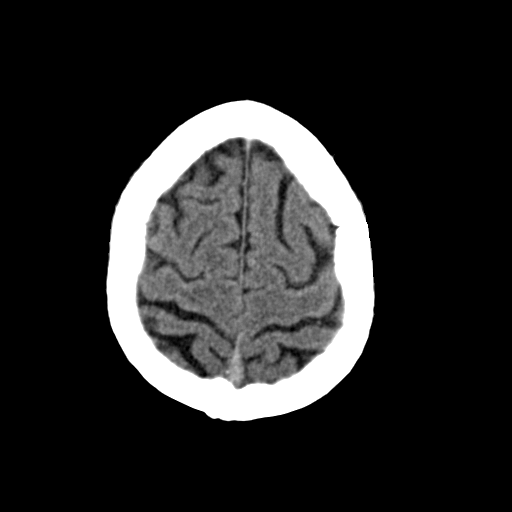
[im 30/34  brain]
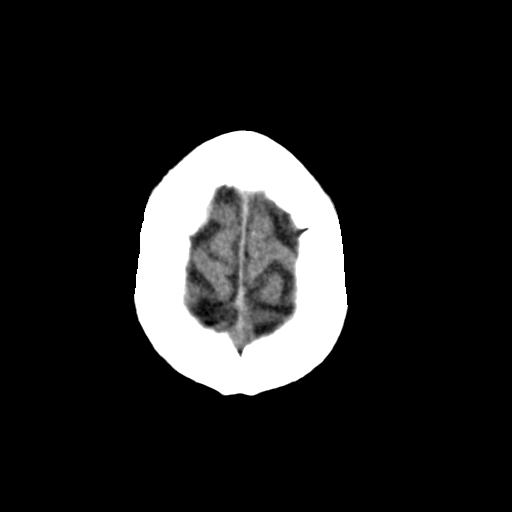
[im 32/34  brain]
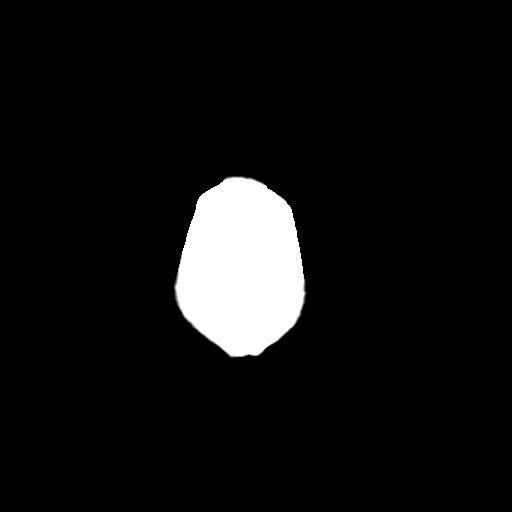

[16 of 30 positions shown; findings below may reference images not displayed]

FINDINGS: Standard nonenhanced CT obtained. No mass. No hydrocephalus. No
hemorrhage. No acute bony abnormality. Paranasal sinuses are clear.
IMPRESSION: No acute abnormality.

## 2015-06-08 IMAGING — CT CT ANGIOGRAPHY HEAD
1 of 2 series · 12 of 20 positions shown · IV contrast (APPLIED)
Comparison: none

REASON FOR EXAM: IV ONLY, GO W/O LABS; HA, DIPLOPIA, FH ANEURSYM
COMMENTS:   May transport without cardiac monitor

PROCEDURE:     CT  - CT ANGIOGRAPHY HEAD W/CONTRAST  - February 22, 2013  [DATE]
RESULT:     History: Pain. Visual disturbance.

[Series 4: soft tissue · axial · 0.42mm/px · z∈[-36,+124]mm · 12 of 63 slices shown]
[im 5/63  soft-tissue]
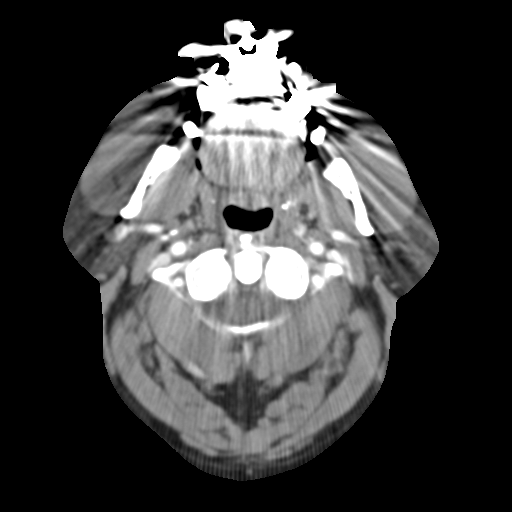
[im 10/63  bone]
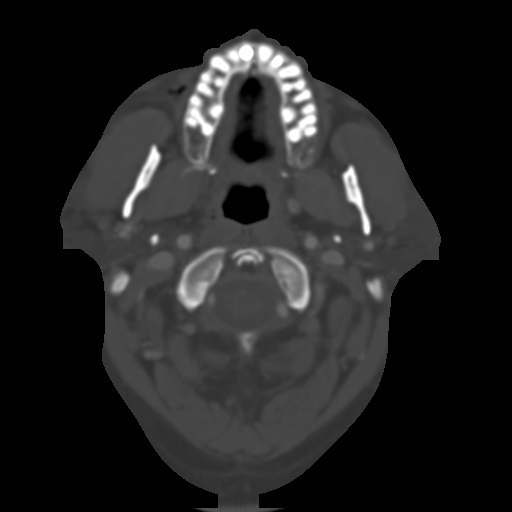
[im 15/63  soft-tissue]
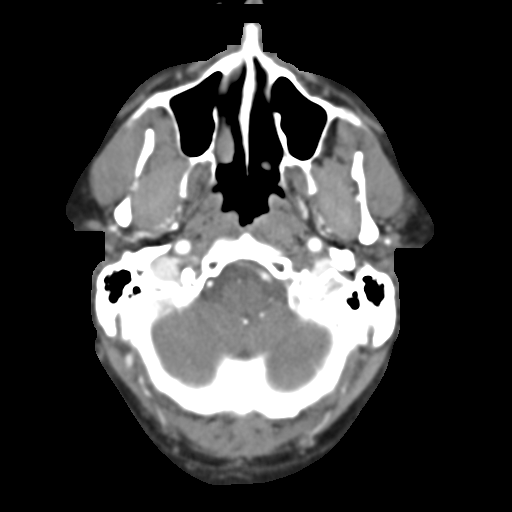
[im 20/63  bone]
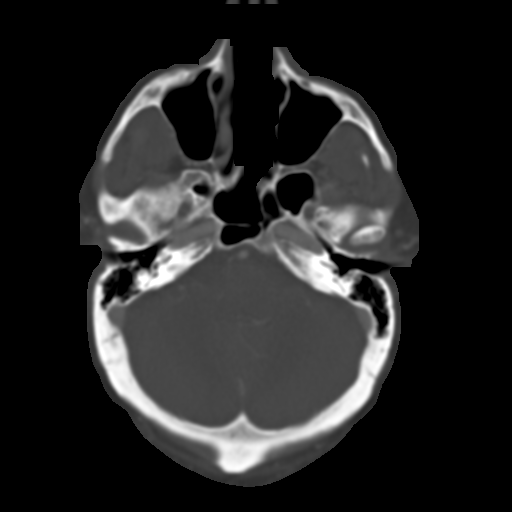
[im 24/63  soft-tissue]
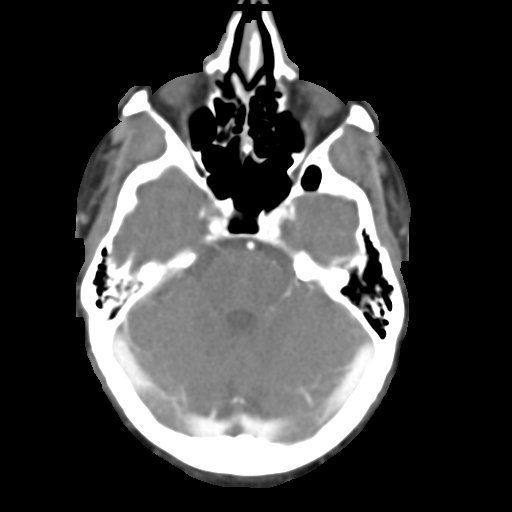
[im 29/63  bone]
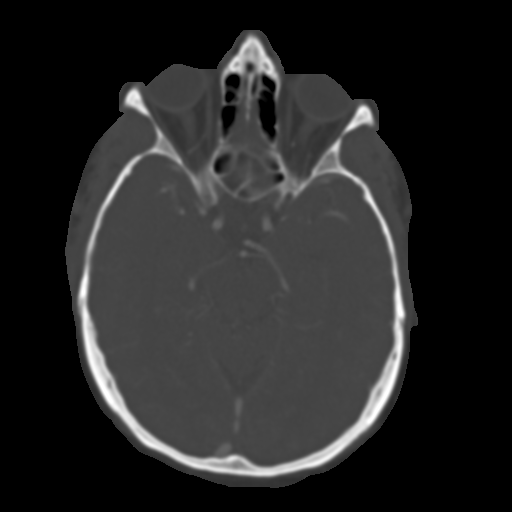
[im 34/63  soft-tissue]
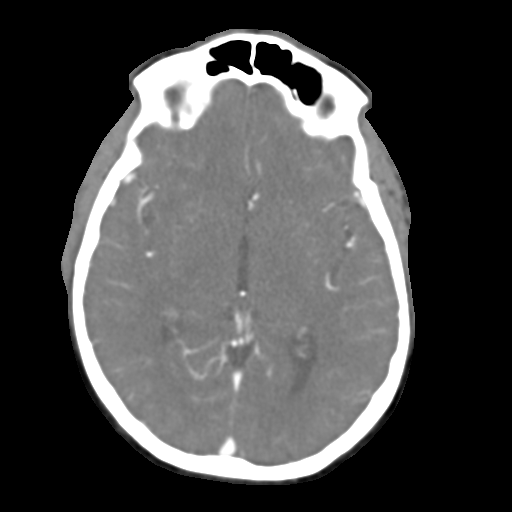
[im 39/63  bone]
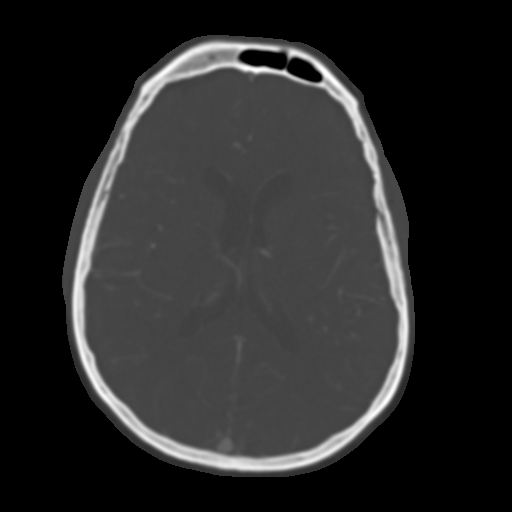
[im 43/63  soft-tissue]
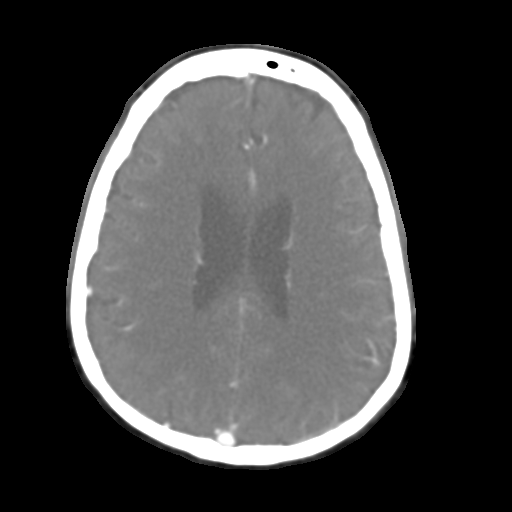
[im 48/63  bone]
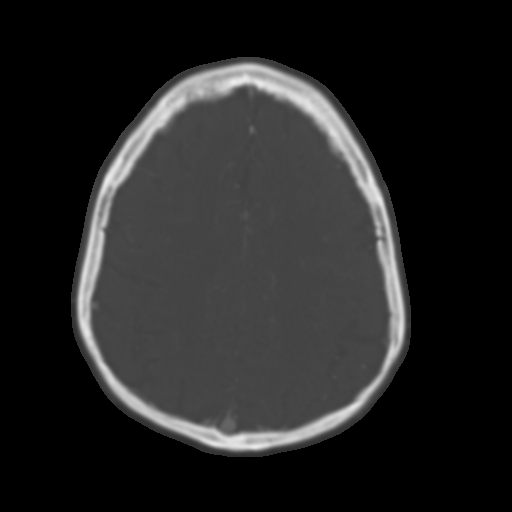
[im 53/63  soft-tissue]
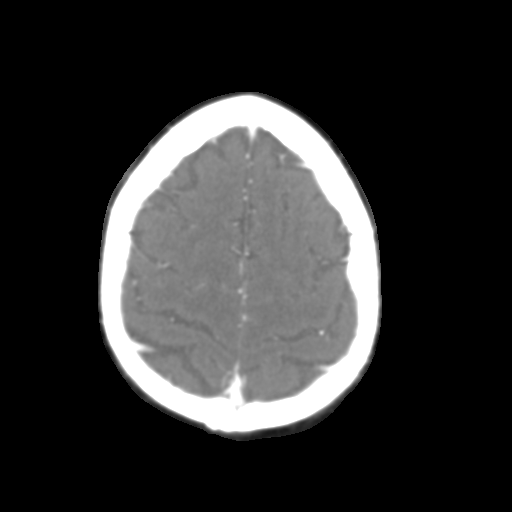
[im 58/63  bone]
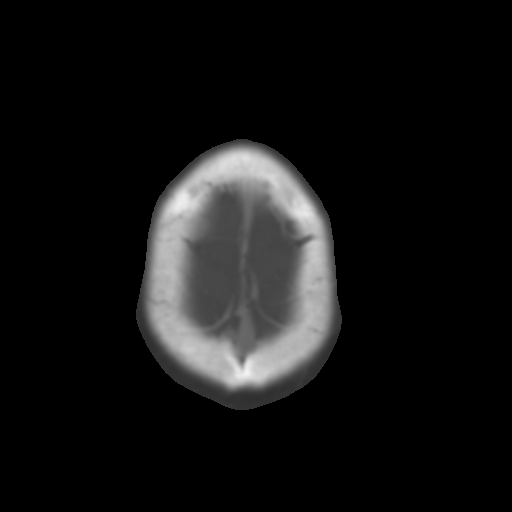

[12 of 20 positions shown; findings below may reference images not displayed]

FINDINGS: Standard CTA obtained with 100 cc of Isovue 370. The anterior and
posterior circulation is widely patent. No evidence of aneurysm or vascular
malformation. No significant atherosclerotic vascular changes are noted.
Orbits including the globes and optic nerves are normal. Paranasal sinuses
are clear.
IMPRESSION: Negative exam.

## 2015-06-26 DIAGNOSIS — M5117 Intervertebral disc disorders with radiculopathy, lumbosacral region: Secondary | ICD-10-CM | POA: Diagnosis not present

## 2015-06-26 DIAGNOSIS — M9905 Segmental and somatic dysfunction of pelvic region: Secondary | ICD-10-CM | POA: Diagnosis not present

## 2015-06-26 DIAGNOSIS — M9903 Segmental and somatic dysfunction of lumbar region: Secondary | ICD-10-CM | POA: Diagnosis not present

## 2015-06-26 DIAGNOSIS — M530 Cervicocranial syndrome: Secondary | ICD-10-CM | POA: Diagnosis not present

## 2015-06-26 DIAGNOSIS — M9901 Segmental and somatic dysfunction of cervical region: Secondary | ICD-10-CM | POA: Diagnosis not present

## 2015-07-07 ENCOUNTER — Ambulatory Visit (INDEPENDENT_AMBULATORY_CARE_PROVIDER_SITE_OTHER): Payer: Medicare Other | Admitting: Family Medicine

## 2015-07-07 ENCOUNTER — Encounter: Payer: Self-pay | Admitting: Family Medicine

## 2015-07-07 VITALS — BP 112/64 | HR 83 | Temp 98.2°F | Ht 70.08 in | Wt 227.6 lb

## 2015-07-07 DIAGNOSIS — J209 Acute bronchitis, unspecified: Secondary | ICD-10-CM | POA: Insufficient documentation

## 2015-07-07 MED ORDER — PREDNISONE 10 MG PO TABS: ORAL_TABLET | ORAL | Status: DC

## 2015-07-07 MED ORDER — HYDROCODONE-HOMATROPINE 5-1.5 MG/5ML PO SYRP: 5.0000 mL | ORAL_SOLUTION | Freq: Three times a day (TID) | ORAL | Status: DC | PRN

## 2015-07-07 MED ORDER — AZITHROMYCIN 250 MG PO TABS: ORAL_TABLET | ORAL | Status: DC

## 2015-07-07 NOTE — Progress Notes (Signed)
Patient ID: MATHYUS FACENDA, male   DOB: [birth year], 65 y.o.   MRN: [id number]  [name], MD Phone: [phone]  Christopher Foster is a 65 y.o. male who presents today for same-day visit.  Patient presents with complaints of symptoms starting Thursday. Notes cough and chest congestion. Notes he did have some sinus congestion though this has improved. Does note continued postnasal drip. He notes his throat feels raw. He is coughing up white mucus. He notes the cough is not improving. He feels tired. No shortness of breath, wheezing, chest pain, or fevers. He has been taking Mucinex with some benefit. He does report a history of pneumonia in the past.  PMH: nonsmoker.   ROS see history of present illness  Objective  Physical Exam Filed Vitals:   07/07/15 1310  BP: 112/64  Pulse: 83  Temp: 98.2 F (36.8 C)    BP Readings from Last 3 Encounters:  07/07/15 112/64  04/22/15 120/72  02/20/15 112/68   Wt Readings from Last 3 Encounters:  07/07/15 227 lb 9.6 oz (103.239 kg)  04/22/15 234 lb 12.8 oz (106.505 kg)  02/20/15 235 lb 6.4 oz (106.777 kg)    Physical Exam  Constitutional: He is well-developed, well-nourished, and in no distress.  HENT:  Head: Normocephalic and atraumatic.  Right Ear: External ear normal.  Left Ear: External ear normal.  Mouth/Throat: No oropharyngeal exudate.  Mild posterior oropharyngeal erythema, normal TMs bilaterally  Eyes: Conjunctivae are normal. Pupils are equal, round, and reactive to light.  Neck: Neck supple.  Cardiovascular: Normal rate, regular rhythm and normal heart sounds.  Exam reveals no gallop and no friction rub.   No murmur heard. Pulmonary/Chest: Effort normal and breath sounds normal. No respiratory distress. He has no wheezes. He has no rales.  Lymphadenopathy:    He has no cervical adenopathy.  Neurological: He is alert. Gait normal.  Skin: Skin is warm and dry. He is not diaphoretic.     Assessment/Plan: Please see  individual problem list.  Acute bronchitis Patient's symptoms most consistent with bronchitis. Discussed likely viral nature and less likely bacterial cause. Benign lung exam and stable vital signs make pneumonia unlikely. Discussed possible treatment options and we'll treat with a prednisone taper. Also provided with a prescription for azithromycin to take if not improved in the next 2-3 days. Advised that if his symptoms were to worsen he should let us know. He was provided with cough suppressant as well. He is given return precautions.    No orders of the defined types were placed in this encounter.    Meds ordered this encounter  Medications  . predniSONE (DELTASONE) 10 MG tablet    Sig: Please take 60 mg (6 tablets) by mouth today, then decrease by one tablet daily    Dispense:  21 tablet    Refill:  0  . azithromycin (ZITHROMAX) 250 MG tablet    Sig: Please take 500 mg (2 tablets) by mouth on day one, then take 250 mg (1 tablet) by mouth days 2-5. Do not fill until 07/09/15.    Dispense:  6 tablet    Refill:  0  . HYDROcodone-homatropine (HYCODAN) 5-1.5 MG/5ML syrup    Sig: Take 5 mLs by mouth every 8 (eight) hours as needed for cough.    Dispense:  120 mL    Refill:  0   [name], MD [place]

## 2015-07-07 NOTE — Patient Instructions (Addendum)
Nice to see you. You likely have bronchitis. This could be related to a virus or bacteria. Most likely this is related to a virus. We will treat you with a prednisone taper to help with your symptoms. If your symptoms do not improve in the next 2 days you can start the azithromycin. If your symptoms worsen or change you should let us know prior to starting the azithromycin. You can additionally take the Hycodan at night for cough. This may make you drowsy. If you Develop chest pain, shortness of breath, wheezing, fevers, cough productive of blood, or any new or changing symptoms please seek medical attention.

## 2015-07-07 NOTE — Progress Notes (Signed)
Pre visit review using our clinic review tool, if applicable. No additional management support is needed unless otherwise documented below in the visit note. 

## 2015-07-07 NOTE — Assessment & Plan Note (Signed)
Patient's symptoms most consistent with bronchitis. Discussed likely viral nature and less likely bacterial cause. Benign lung exam and stable vital signs make pneumonia unlikely. Discussed possible treatment options and we'll treat with a prednisone taper. Also provided with a prescription for azithromycin to take if not improved in the next 2-3 days. Advised that if his symptoms were to worsen he should let us know. He was provided with cough suppressant as well. He is given return precautions.

## 2015-09-25 DIAGNOSIS — M9903 Segmental and somatic dysfunction of lumbar region: Secondary | ICD-10-CM | POA: Diagnosis not present

## 2015-09-25 DIAGNOSIS — M9905 Segmental and somatic dysfunction of pelvic region: Secondary | ICD-10-CM | POA: Diagnosis not present

## 2015-09-25 DIAGNOSIS — M461 Sacroiliitis, not elsewhere classified: Secondary | ICD-10-CM | POA: Diagnosis not present

## 2015-09-25 DIAGNOSIS — M5441 Lumbago with sciatica, right side: Secondary | ICD-10-CM | POA: Diagnosis not present

## 2015-10-14 DIAGNOSIS — L821 Other seborrheic keratosis: Secondary | ICD-10-CM | POA: Diagnosis not present

## 2015-10-14 DIAGNOSIS — Z08 Encounter for follow-up examination after completed treatment for malignant neoplasm: Secondary | ICD-10-CM | POA: Diagnosis not present

## 2015-10-14 DIAGNOSIS — L218 Other seborrheic dermatitis: Secondary | ICD-10-CM | POA: Diagnosis not present

## 2015-10-14 DIAGNOSIS — Z85828 Personal history of other malignant neoplasm of skin: Secondary | ICD-10-CM | POA: Diagnosis not present

## 2015-12-30 DIAGNOSIS — Z23 Encounter for immunization: Secondary | ICD-10-CM | POA: Diagnosis not present

## 2016-01-01 DIAGNOSIS — M9903 Segmental and somatic dysfunction of lumbar region: Secondary | ICD-10-CM | POA: Diagnosis not present

## 2016-01-01 DIAGNOSIS — M461 Sacroiliitis, not elsewhere classified: Secondary | ICD-10-CM | POA: Diagnosis not present

## 2016-01-01 DIAGNOSIS — M9905 Segmental and somatic dysfunction of pelvic region: Secondary | ICD-10-CM | POA: Diagnosis not present

## 2016-01-01 DIAGNOSIS — M5441 Lumbago with sciatica, right side: Secondary | ICD-10-CM | POA: Diagnosis not present

## 2016-02-02 DIAGNOSIS — E041 Nontoxic single thyroid nodule: Secondary | ICD-10-CM | POA: Diagnosis not present

## 2016-02-03 IMAGING — US US SCROTUM
1 series · 13 of 25 positions shown · non-contrast
Comparison: None.

CLINICAL DATA: 64-year-old presenting with a soft, nontender left
scrotal mass initially noted approximately 6 weeks ago without
interval growth.

EXAM:
SCROTAL ULTRASOUND
DOPPLER ULTRASOUND OF THE TESTICLES
TECHNIQUE: Complete ultrasound examination of the testicles, epididymis, and
other scrotal structures was performed. Color and spectral Doppler
ultrasound were also utilized to evaluate blood flow to the
testicles.

[Series 1: us scrotum · 0.06mm/px · 13 of 73 slices shown]
[im 1/73]
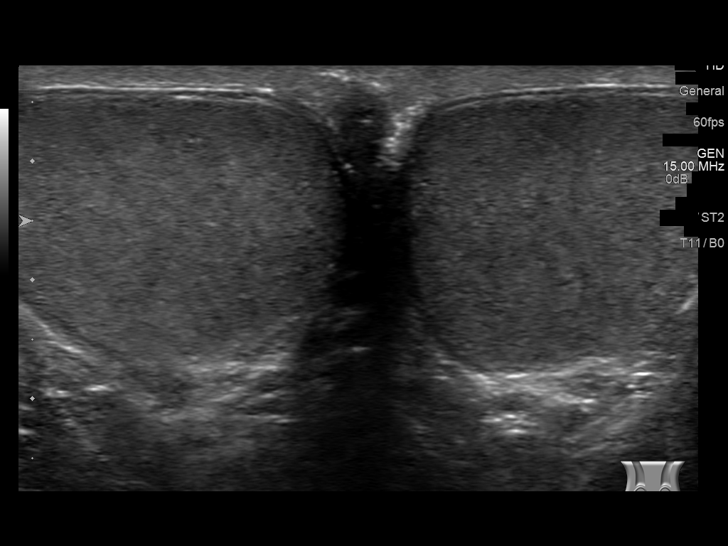
[im 7/73]
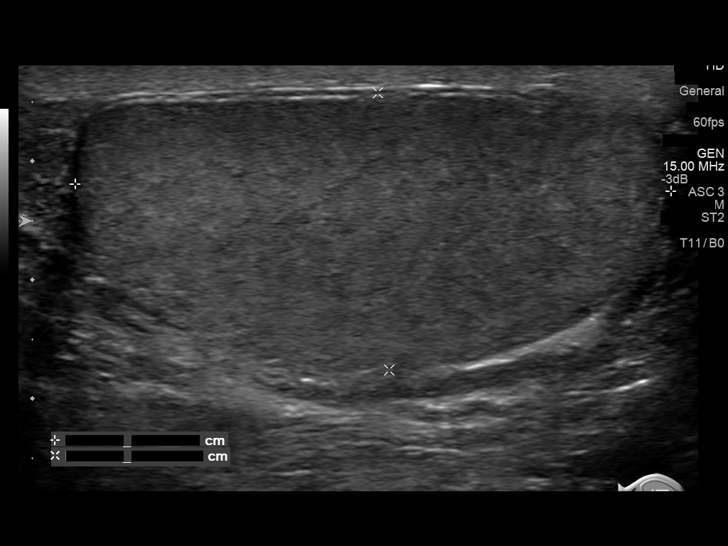
[im 13/73]
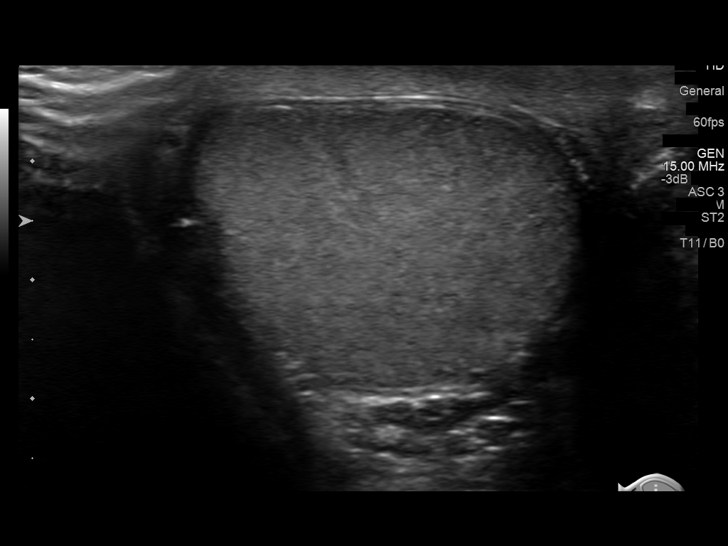
[im 19/73]
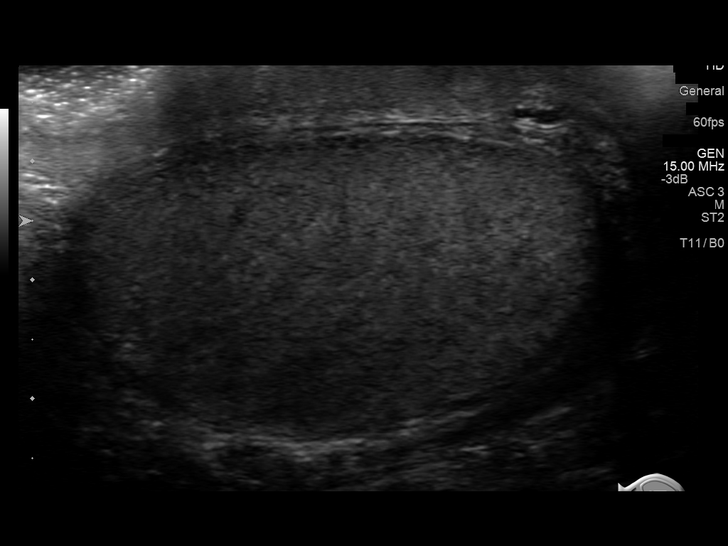
[im 25/73]
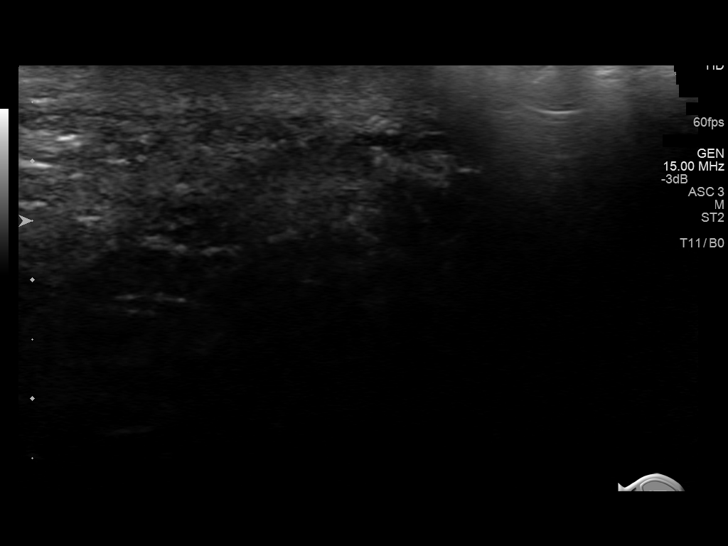
[im 31/73]
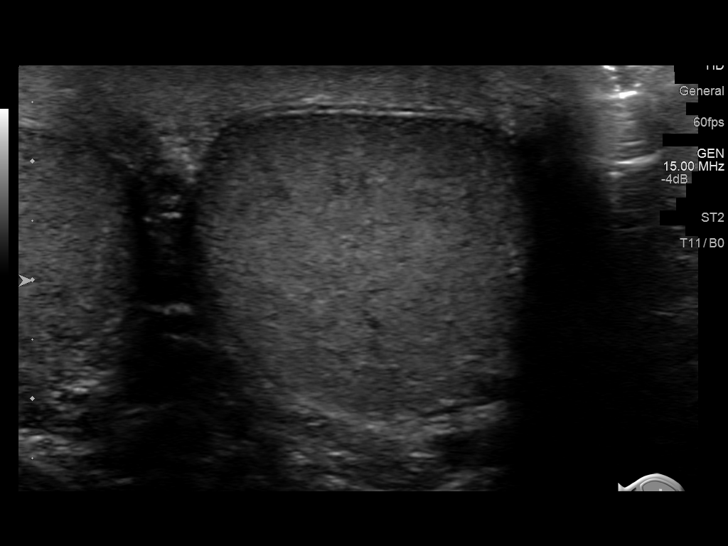
[im 37/73]
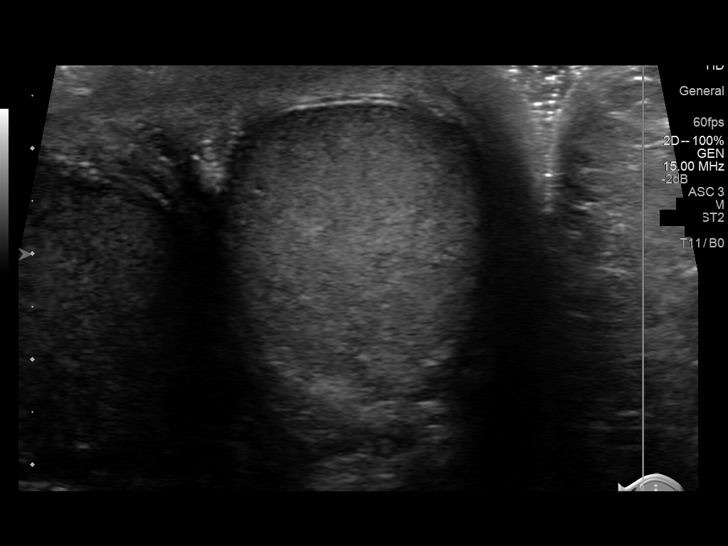
[im 43/73]
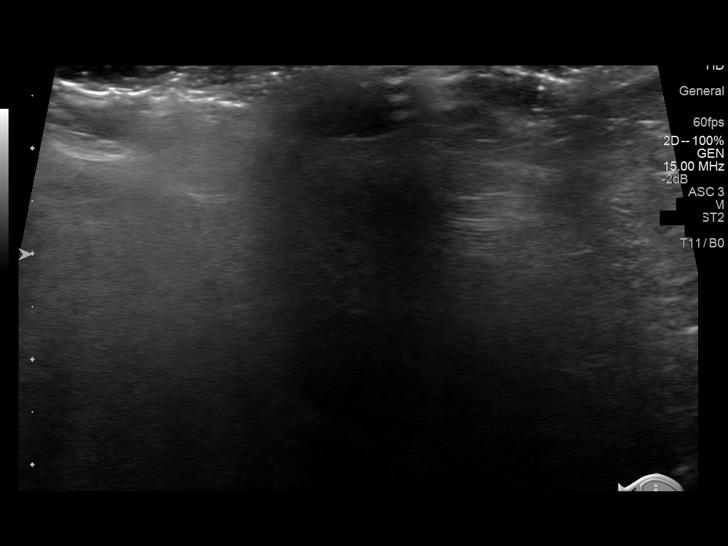
[im 49/73]
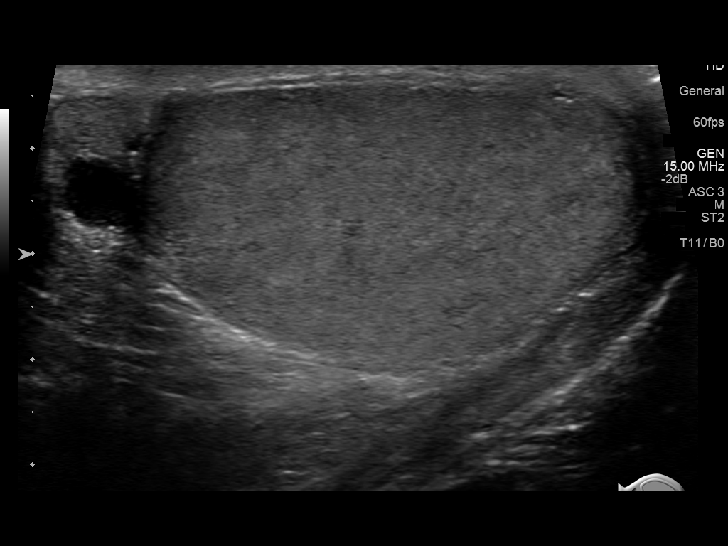
[im 55/73]
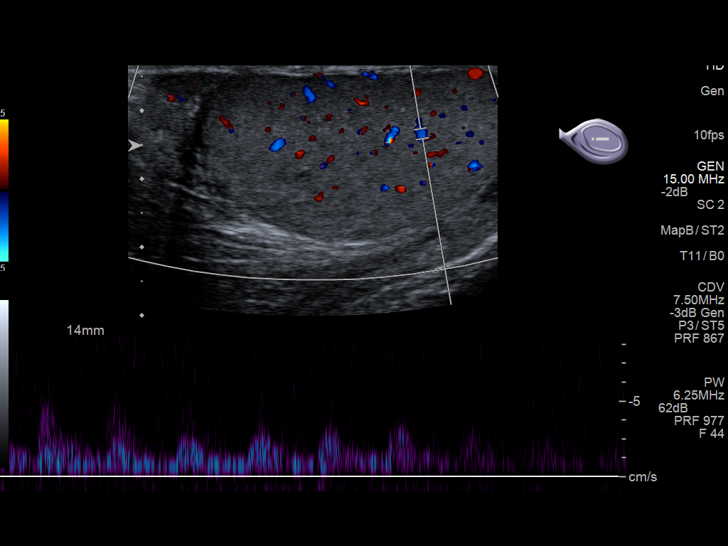
[im 61/73]
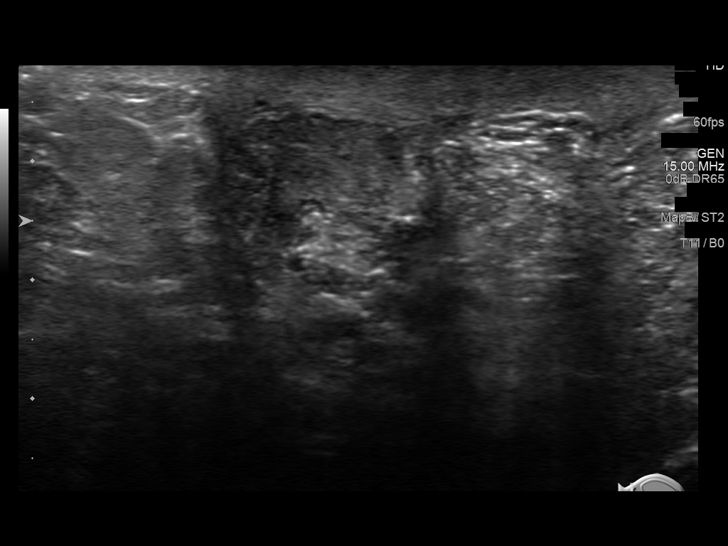
[im 67/73]
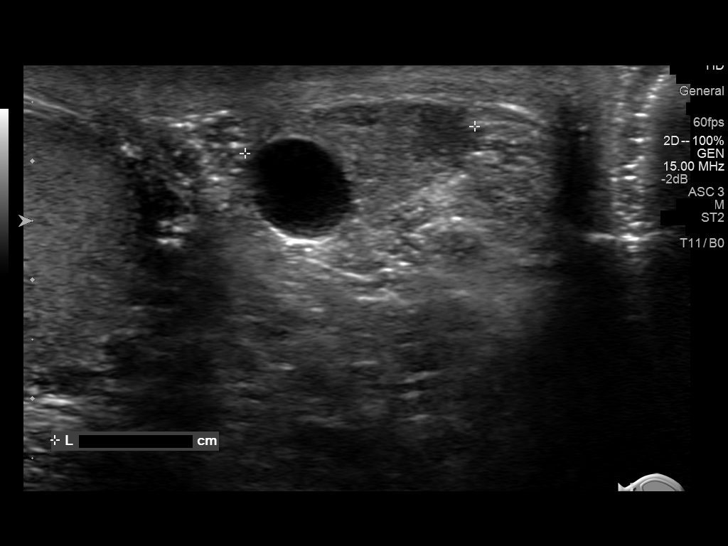
[im 73/73]
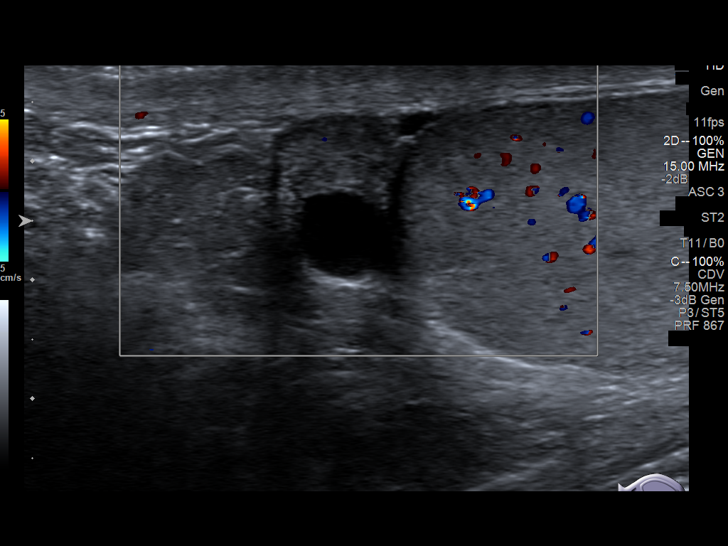

[13 of 25 positions shown; findings below may reference images not displayed]

FINDINGS: Right testicle

Measurements: Approximately 5.0 x 2.3 x 3.2 cm. Normal parenchymal
echotexture without mass or microlithiasis. Normal color Doppler
flow without evidence of hyperemia.

Left testicle

Measurements: Approximately 4.8 x 2.6 x 2.9 cm. Normal parenchymal
echotexture without mass or microlithiasis. Normal color Doppler
flow without evidence of hyperemia.

Right epididymis: Normal in size and appearance without evidence of
hyperemia.

Left epididymis: Normal in size containing an approximate 9 x 8 x 9
mm cyst or spermatocele. Normal color Doppler flow without evidence
of hyperemia.

Hydrocele:  Absent bilaterally.

Varicocele:  Absent bilaterally.

Pulsed Doppler interrogation of both testes demonstrates normal low
resistance arterial and venous waveforms bilaterally.
IMPRESSION: 1. Benign 9 mm cyst or spermatocele involving the head of the left
epididymis.
2. Otherwise normal examination.

## 2016-02-09 DIAGNOSIS — E041 Nontoxic single thyroid nodule: Secondary | ICD-10-CM | POA: Diagnosis not present

## 2016-03-03 DIAGNOSIS — E041 Nontoxic single thyroid nodule: Secondary | ICD-10-CM | POA: Diagnosis not present

## 2016-04-01 DIAGNOSIS — M5441 Lumbago with sciatica, right side: Secondary | ICD-10-CM | POA: Diagnosis not present

## 2016-04-01 DIAGNOSIS — M461 Sacroiliitis, not elsewhere classified: Secondary | ICD-10-CM | POA: Diagnosis not present

## 2016-04-01 DIAGNOSIS — M9903 Segmental and somatic dysfunction of lumbar region: Secondary | ICD-10-CM | POA: Diagnosis not present

## 2016-04-01 DIAGNOSIS — M9905 Segmental and somatic dysfunction of pelvic region: Secondary | ICD-10-CM | POA: Diagnosis not present

## 2016-04-28 DIAGNOSIS — C8290 Follicular lymphoma, unspecified, unspecified site: Secondary | ICD-10-CM | POA: Diagnosis not present

## 2016-04-28 DIAGNOSIS — E669 Obesity, unspecified: Secondary | ICD-10-CM | POA: Diagnosis not present

## 2016-04-28 DIAGNOSIS — E041 Nontoxic single thyroid nodule: Secondary | ICD-10-CM | POA: Diagnosis not present

## 2016-04-28 DIAGNOSIS — C73 Malignant neoplasm of thyroid gland: Secondary | ICD-10-CM | POA: Diagnosis not present

## 2016-04-29 DIAGNOSIS — E041 Nontoxic single thyroid nodule: Secondary | ICD-10-CM | POA: Diagnosis not present

## 2016-04-29 DIAGNOSIS — E669 Obesity, unspecified: Secondary | ICD-10-CM | POA: Diagnosis not present

## 2016-04-29 DIAGNOSIS — C73 Malignant neoplasm of thyroid gland: Secondary | ICD-10-CM | POA: Diagnosis not present

## 2016-05-05 ENCOUNTER — Telehealth: Payer: Self-pay | Admitting: *Deleted

## 2016-05-05 NOTE — Telephone Encounter (Signed)
Patient wife notified and states she will have him call back to schedule

## 2016-05-05 NOTE — Telephone Encounter (Signed)
Please advise 

## 2016-05-05 NOTE — Telephone Encounter (Signed)
Patient can certainly have the pneumonia vaccine even though he has had pneumonia in the past. Does not appear that he has had in our system. I would suggest setting up a follow-up for a physical as it has been almost a year since he has been seen. Thanks.

## 2016-05-05 NOTE — Telephone Encounter (Signed)
Patient's wife questioned if pt can have a pneumonia vaccine despite him having pneumonia in the past. She would also like to know if pt has has a pneumonia vaccine in the past  Pt contact [phone]

## 2016-05-18 DIAGNOSIS — C73 Malignant neoplasm of thyroid gland: Secondary | ICD-10-CM | POA: Diagnosis not present

## 2016-05-18 DIAGNOSIS — D34 Benign neoplasm of thyroid gland: Secondary | ICD-10-CM | POA: Diagnosis not present

## 2016-05-18 DIAGNOSIS — E890 Postprocedural hypothyroidism: Secondary | ICD-10-CM | POA: Diagnosis not present

## 2016-06-17 DIAGNOSIS — E890 Postprocedural hypothyroidism: Secondary | ICD-10-CM | POA: Diagnosis not present

## 2016-06-22 DIAGNOSIS — E890 Postprocedural hypothyroidism: Secondary | ICD-10-CM | POA: Diagnosis not present

## 2016-06-22 DIAGNOSIS — C73 Malignant neoplasm of thyroid gland: Secondary | ICD-10-CM | POA: Diagnosis not present

## 2016-06-24 DIAGNOSIS — M9905 Segmental and somatic dysfunction of pelvic region: Secondary | ICD-10-CM | POA: Diagnosis not present

## 2016-06-24 DIAGNOSIS — M9903 Segmental and somatic dysfunction of lumbar region: Secondary | ICD-10-CM | POA: Diagnosis not present

## 2016-06-24 DIAGNOSIS — M461 Sacroiliitis, not elsewhere classified: Secondary | ICD-10-CM | POA: Diagnosis not present

## 2016-06-24 DIAGNOSIS — M5441 Lumbago with sciatica, right side: Secondary | ICD-10-CM | POA: Diagnosis not present

## 2016-08-23 ENCOUNTER — Telehealth: Payer: Self-pay | Admitting: Family Medicine

## 2016-08-23 ENCOUNTER — Encounter: Payer: Self-pay | Admitting: *Deleted

## 2016-08-23 NOTE — Telephone Encounter (Signed)
Okay to have follow-up in June.

## 2016-08-23 NOTE — Telephone Encounter (Signed)
Patient contacted for quality metric gaps in care , patient would like to have cologuard if approved by PCP, also need to schedule 30 minute follow up for quality metric but has not seen PCP in over a year can I push visit out looks like next 30 minute available in June.

## 2016-08-24 DIAGNOSIS — H25813 Combined forms of age-related cataract, bilateral: Secondary | ICD-10-CM | POA: Diagnosis not present

## 2016-08-24 NOTE — Telephone Encounter (Signed)
Please try cell phone.

## 2016-08-24 NOTE — Telephone Encounter (Signed)
Please schedule patient in June for Follow up for with PCP reason Quality Metric Gaps in care. Thanks.

## 2016-08-24 NOTE — Telephone Encounter (Signed)
Ok. I called pt and left a vm to call the office to sch. Thank you!

## 2016-10-11 ENCOUNTER — Encounter: Payer: Self-pay | Admitting: Family Medicine

## 2016-10-11 ENCOUNTER — Ambulatory Visit (INDEPENDENT_AMBULATORY_CARE_PROVIDER_SITE_OTHER): Payer: Medicare Other | Admitting: Family Medicine

## 2016-10-11 VITALS — BP 130/68 | HR 64 | Temp 97.9°F | Wt 236.8 lb

## 2016-10-11 DIAGNOSIS — R7309 Other abnormal glucose: Secondary | ICD-10-CM | POA: Diagnosis not present

## 2016-10-11 DIAGNOSIS — E041 Nontoxic single thyroid nodule: Secondary | ICD-10-CM

## 2016-10-11 DIAGNOSIS — E7800 Pure hypercholesterolemia, unspecified: Secondary | ICD-10-CM | POA: Diagnosis not present

## 2016-10-11 DIAGNOSIS — E785 Hyperlipidemia, unspecified: Secondary | ICD-10-CM | POA: Diagnosis not present

## 2016-10-11 LAB — COMPREHENSIVE METABOLIC PANEL
ALBUMIN: 4.9 g/dL (ref 3.5–5.2)
ALT: 26 U/L (ref 0–53)
AST: 22 U/L (ref 0–37)
Alkaline Phosphatase: 96 U/L (ref 39–117)
BUN: 20 mg/dL (ref 6–23)
CHLORIDE: 103 meq/L (ref 96–112)
CO2: 30 meq/L (ref 19–32)
CREATININE: 1.02 mg/dL (ref 0.40–1.50)
Calcium: 10 mg/dL (ref 8.4–10.5)
GFR: 77.15 mL/min (ref >60.00)
GLUCOSE: 116 mg/dL — AB (ref 70–99)
POTASSIUM: 5.5 meq/L — AB (ref 3.5–5.1)
Sodium: 140 mEq/L (ref 135–145)
Total Bilirubin: 0.5 mg/dL (ref 0.2–1.2)
Total Protein: 7.3 g/dL (ref 6.0–8.3)

## 2016-10-11 LAB — LIPID PANEL
CHOL/HDL RATIO: 5
Cholesterol: 251 mg/dL — ABNORMAL HIGH (ref 0–200)
HDL: 48.7 mg/dL (ref >39.00)
LDL CALC: 166 mg/dL — AB (ref 0–99)
NONHDL: 202.22
Triglycerides: 180 mg/dL — ABNORMAL HIGH (ref 0.0–149.0)
VLDL: 36 mg/dL (ref 0.0–40.0)

## 2016-10-11 LAB — HEMOGLOBIN A1C: HEMOGLOBIN A1C: 6 % (ref 4.6–6.5)

## 2016-10-11 MED ORDER — PNEUMOCOCCAL 13-VAL CONJ VACC IM SUSP: 0.5000 mL | Freq: Once | INTRAMUSCULAR | 0 refills | Status: AC

## 2016-10-11 NOTE — Progress Notes (Signed)
  [name], MD Phone: [phone]  Christopher Foster is a 66 y.o. male who presents today for follow-up.  Hyperlipidemia: Currently taking niacin and aspirin. No chest pain or shortness of breath. No medications for this.  Patient had a cancerous area of his thyroid removed earlier this year. He has done well since then. He has followed with endocrinology and his last TSH was normal.  He reports a prior colonoscopy that [facility]. CMA contacted the patient's pharmacy and they noted he had not had a pneumonia vaccine last year. He did have a flu vaccination.  PMH: nonsmoker.   ROS see history of present illness  Objective  Physical Exam Vitals:   10/11/16 1004  BP: 130/68  Pulse: 64  Temp: 97.9 F (36.6 C)    BP Readings from Last 3 Encounters:  10/11/16 130/68  07/07/15 112/64  04/22/15 120/72   Wt Readings from Last 3 Encounters:  10/11/16 236 lb 12.8 oz (107.4 kg)  07/07/15 227 lb 9.6 oz (103.2 kg)  04/22/15 234 lb 12.8 oz (106.5 kg)    Physical Exam  Constitutional: No distress.  Cardiovascular: Normal rate, regular rhythm and normal heart sounds.   Pulmonary/Chest: Effort normal and breath sounds normal.  Musculoskeletal: He exhibits no edema.  Neurological: He is alert. Gait normal.  Skin: Skin is warm and dry. He is not diaphoretic.     Assessment/Plan: Please see individual problem list.  Thyroid nodule Removal of part of his thyroid earlier this year. He will continue to follow with endocrinology.  Hyperlipidemia Lab work as outlined below.   Orders Placed This Encounter  Procedures  . Lipid Profile  . Comp Met (CMET)  . HgB A1c    Meds ordered this encounter  Medications  . pneumococcal 13-valent conjugate vaccine (PREVNAR 13) SUSP injection    Sig: Inject 0.5 mLs into the muscle once.    Dispense:  0.5 mL    Refill:  0    # Healthcare maintenance: We will request his colonoscopy results from [facility]. He was provided with a  Prevnar vaccine prescription.   [name], MD [place]

## 2016-10-11 NOTE — Assessment & Plan Note (Signed)
Lab work as outlined below.

## 2016-10-11 NOTE — Assessment & Plan Note (Addendum)
Removal of part of his thyroid earlier this year. He will continue to follow with endocrinology.

## 2016-10-11 NOTE — Patient Instructions (Signed)
Nice to see you. We will check some lab work today and contact you with the results. We will request records regarding your colonoscopy from [facility].

## 2016-10-13 DIAGNOSIS — X32XXXA Exposure to sunlight, initial encounter: Secondary | ICD-10-CM | POA: Diagnosis not present

## 2016-10-13 DIAGNOSIS — Z85828 Personal history of other malignant neoplasm of skin: Secondary | ICD-10-CM | POA: Diagnosis not present

## 2016-10-13 DIAGNOSIS — L570 Actinic keratosis: Secondary | ICD-10-CM | POA: Diagnosis not present

## 2016-10-13 DIAGNOSIS — D225 Melanocytic nevi of trunk: Secondary | ICD-10-CM | POA: Diagnosis not present

## 2016-10-13 DIAGNOSIS — D2272 Melanocytic nevi of left lower limb, including hip: Secondary | ICD-10-CM | POA: Diagnosis not present

## 2016-10-13 DIAGNOSIS — D2261 Melanocytic nevi of right upper limb, including shoulder: Secondary | ICD-10-CM | POA: Diagnosis not present

## 2016-10-14 DIAGNOSIS — M5441 Lumbago with sciatica, right side: Secondary | ICD-10-CM | POA: Diagnosis not present

## 2016-10-14 DIAGNOSIS — M461 Sacroiliitis, not elsewhere classified: Secondary | ICD-10-CM | POA: Diagnosis not present

## 2016-10-14 DIAGNOSIS — M9905 Segmental and somatic dysfunction of pelvic region: Secondary | ICD-10-CM | POA: Diagnosis not present

## 2016-10-14 DIAGNOSIS — M9903 Segmental and somatic dysfunction of lumbar region: Secondary | ICD-10-CM | POA: Diagnosis not present

## 2016-11-05 DIAGNOSIS — E890 Postprocedural hypothyroidism: Secondary | ICD-10-CM | POA: Diagnosis not present

## 2016-12-07 ENCOUNTER — Ambulatory Visit: Payer: Medicare Other

## 2016-12-30 DIAGNOSIS — M9905 Segmental and somatic dysfunction of pelvic region: Secondary | ICD-10-CM | POA: Diagnosis not present

## 2016-12-30 DIAGNOSIS — M9903 Segmental and somatic dysfunction of lumbar region: Secondary | ICD-10-CM | POA: Diagnosis not present

## 2016-12-30 DIAGNOSIS — M461 Sacroiliitis, not elsewhere classified: Secondary | ICD-10-CM | POA: Diagnosis not present

## 2016-12-30 DIAGNOSIS — M5441 Lumbago with sciatica, right side: Secondary | ICD-10-CM | POA: Diagnosis not present

## 2017-01-04 DIAGNOSIS — Z23 Encounter for immunization: Secondary | ICD-10-CM | POA: Diagnosis not present

## 2017-01-11 ENCOUNTER — Telehealth: Payer: Self-pay | Admitting: Family Medicine

## 2017-01-11 NOTE — Telephone Encounter (Signed)
Left pt message asking to call Allison back directly at [phone] to schedule AWV. Thanks! °  °*NOTE* No hx of AWV °

## 2017-01-12 NOTE — Telephone Encounter (Signed)
Spoke to pt. He was seen in June for Quality Metric appt. Pt declined AWV at this time.

## 2017-01-26 DIAGNOSIS — E890 Postprocedural hypothyroidism: Secondary | ICD-10-CM | POA: Diagnosis not present

## 2017-01-26 DIAGNOSIS — C73 Malignant neoplasm of thyroid gland: Secondary | ICD-10-CM | POA: Diagnosis not present

## 2017-03-28 ENCOUNTER — Telehealth: Payer: Self-pay | Admitting: Family Medicine

## 2017-03-28 NOTE — Telephone Encounter (Signed)
Informed patient he will be due after 04/12/2017

## 2017-03-28 NOTE — Telephone Encounter (Signed)
Pt wanted to know if he is due for a tdap shot  Please advise pt he also has a couple question regardingh the shot

## 2017-03-31 DIAGNOSIS — M5441 Lumbago with sciatica, right side: Secondary | ICD-10-CM | POA: Diagnosis not present

## 2017-03-31 DIAGNOSIS — M9905 Segmental and somatic dysfunction of pelvic region: Secondary | ICD-10-CM | POA: Diagnosis not present

## 2017-03-31 DIAGNOSIS — M9903 Segmental and somatic dysfunction of lumbar region: Secondary | ICD-10-CM | POA: Diagnosis not present

## 2017-03-31 DIAGNOSIS — M461 Sacroiliitis, not elsewhere classified: Secondary | ICD-10-CM | POA: Diagnosis not present

## 2017-05-02 ENCOUNTER — Encounter: Payer: Self-pay | Admitting: Family Medicine

## 2017-05-02 ENCOUNTER — Ambulatory Visit (INDEPENDENT_AMBULATORY_CARE_PROVIDER_SITE_OTHER): Payer: Medicare Other | Admitting: Family Medicine

## 2017-05-02 VITALS — BP 138/84 | HR 71 | Temp 98.0°F | Resp 18 | Ht 70.0 in | Wt 240.0 lb

## 2017-05-02 DIAGNOSIS — J0100 Acute maxillary sinusitis, unspecified: Secondary | ICD-10-CM | POA: Diagnosis not present

## 2017-05-02 DIAGNOSIS — R059 Cough, unspecified: Secondary | ICD-10-CM

## 2017-05-02 DIAGNOSIS — R05 Cough: Secondary | ICD-10-CM | POA: Diagnosis not present

## 2017-05-02 MED ORDER — HYDROCODONE-HOMATROPINE 5-1.5 MG/5ML PO SYRP: 5.0000 mL | ORAL_SOLUTION | Freq: Three times a day (TID) | ORAL | 0 refills | Status: DC | PRN

## 2017-05-02 MED ORDER — AMOXICILLIN-POT CLAVULANATE 875-125 MG PO TABS: 1.0000 | ORAL_TABLET | Freq: Two times a day (BID) | ORAL | 0 refills | Status: DC

## 2017-05-02 MED ORDER — BENZONATATE 100 MG PO CAPS: 100.0000 mg | ORAL_CAPSULE | Freq: Three times a day (TID) | ORAL | 0 refills | Status: DC

## 2017-05-02 NOTE — Patient Instructions (Signed)
Please take medication as directed.  Advised patient on supportive measures:  Get rest, drink plenty of fluids, and use benzonatate for cough or syrup at night if needed. Follow up if fever >101, if symptoms worsen or if symptoms are not improved with treatment.   Sinusitis, Adult Sinusitis is soreness and inflammation of your sinuses. Sinuses are hollow spaces in the bones around your face. They are located:  Around your eyes.  In the middle of your forehead.  Behind your nose.  In your cheekbones.  Your sinuses and nasal passages are lined with a stringy fluid (mucus). Mucus normally drains out of your sinuses. When your nasal tissues get inflamed or swollen, the mucus can get trapped or blocked so air cannot flow through your sinuses. This lets bacteria, viruses, and funguses grow, and that leads to infection. Follow these instructions at home: Medicines  Take, use, or apply over-the-counter and prescription medicines only as told by your doctor. These may include nasal sprays.  If you were prescribed an antibiotic medicine, take it as told by your doctor. Do not stop taking the antibiotic even if you start to feel better. Hydrate and Humidify  Drink enough water to keep your pee (urine) clear or pale yellow.  Use a cool mist humidifier to keep the humidity level in your home above 50%.  Breathe in steam for 10-15 minutes, 3-4 times a day or as told by your doctor. You can do this in the bathroom while a hot shower is running.  Try not to spend time in cool or dry air. Rest  Rest as much as possible.  Sleep with your head raised (elevated).  Make sure to get enough sleep each night. General instructions  Put a warm, moist washcloth on your face 3-4 times a day or as told by your doctor. This will help with discomfort.  Wash your hands often with soap and water. If there is no soap and water, use hand sanitizer.  Do not smoke. Avoid being around people who are smoking  (secondhand smoke).  Keep all follow-up visits as told by your doctor. This is important. Contact a doctor if:  You have a fever.  Your symptoms get worse.  Your symptoms do not get better within 10 days. Get help right away if:  You have a very bad headache.  You cannot stop throwing up (vomiting).  You have pain or swelling around your face or eyes.  You have trouble seeing.  You feel confused.  Your neck is stiff.  You have trouble breathing. This information is not intended to replace advice given to you by your health care provider. Make sure you discuss any questions you have with your health care provider. Document Released: 09/29/2007 Document Revised: 12/07/2015 Document Reviewed: 02/05/2015 Elsevier Interactive Patient Education  [employer].

## 2017-05-02 NOTE — Progress Notes (Signed)
Patient ID: [name], male   DOB: [birth year], 67 y.o.   MRN: [id number]  PCP: Leone Haven, MD  Subjective:  Christopher Foster is a 67 y.o. year old very pleasant male patient who presents with symptoms including nasal congestion, cough that is productive of yellow sputum. Sinus pressure and post nasal drip have been present. He reports sinus drainage that is thick, yellow/green and worsening. He reports a remote history of pneumonia.  -started: 8 days ago symptoms are worsening  -previous treatments:  -sick contacts/travel/risks: denies flu exposure. Influenza vaccine is UTD. -Hx of: hyperlipidemia  ROS-denies fever, SOB, NVD, tooth pain  Pertinent Past Medical History- reflux  Medications- reviewed  Current Outpatient Medications  Medication Sig Dispense Refill  . aspirin EC 81 MG tablet Take by mouth.    . Cinnamon 500 MG capsule Take by mouth.    . niacin (NIASPAN) 1000 MG CR tablet Take by mouth.    . Omega-3 Fatty Acids (FISH OIL) 1000 MG CAPS Take by mouth.    Marland Kitchen omeprazole (PRILOSEC) 40 MG capsule Take by mouth.    . SM MULTIPLE VITAMINS/IRON TABS Take by mouth.    . vitamin E 100 UNIT capsule Take by mouth.     No current facility-administered medications for this visit.     Objective: BP 138/84 (BP Location: Left Arm, Patient Position: Sitting, Cuff Size: Large)   Pulse 71   Temp 98 F (36.7 C)   Resp 18   Ht 5\' 10"  (1.778 m)   Wt 240 lb (108.9 kg)   SpO2 96%   BMI 34.44 kg/m  Gen: NAD, resting comfortably HEENT: Turbinates erythematous, TM normal, pharynx mildly erythematous with no tonsilar exudate or edema, + maxillary sinus tenderness, minimal frontal sinus tenderness noted. CV: RRR no murmurs rubs or gallops Lungs: CTAB no crackles, wheeze, rhonchi Abdomen: soft/nontender/nondistended/normal bowel sounds. No rebound or guarding.  Ext: no edema Skin: warm, dry, no rash Neuro: grossly normal, moves all extremities  Assessment/Plan:  1. Acute maxillary  sinusitis, recurrence not specified Exam is reassuring, We discussed that this can be viral in nature. Lung exam is unremarkable and stable vital signs. Most concerning symptoms for patient are sinus drainage and discomfort. Exam and history support empiric treatment for bacterial sinus infection. Advised patient on  supportive measures:  Get rest, drink plenty of fluids, and follow up if fever >101, if symptoms worsen or if symptoms are not improved with treatment in 3-4 days. Patient  verbalizes understanding and agrees with plan.  2. Cough Lungs CTA. Benzonatate for cough or hycodan an night if needed.    Finally, we reviewed reasons to return to care including if symptoms worsen or persist or new concerns arise- once again particularly shortness of breath or fever.    Laurita Quint, FNP

## 2017-05-03 ENCOUNTER — Telehealth: Payer: Self-pay | Admitting: Family Medicine

## 2017-05-03 NOTE — Telephone Encounter (Signed)
Pt's wife, Izora Gala calling with the pt beside her asking if the pt has received the max dose of Hycodan. Pt's wife states the pt did not sleep last night and wanted to know if medication could be increased. Pt has only taken one dose of the Hycodan last night and started Benzonatate today. Advised the pt and his wife that sometimes the medication takes a little time to start working and he should continue to to Benzonatate 3 times a day and take Hycodan in between to see if this will help cough. Advised pt and his wife that he still has no relief after taking the medication for a couple of days to call the office back. Understanding verbalized.

## 2017-06-30 DIAGNOSIS — M5441 Lumbago with sciatica, right side: Secondary | ICD-10-CM | POA: Diagnosis not present

## 2017-06-30 DIAGNOSIS — M9903 Segmental and somatic dysfunction of lumbar region: Secondary | ICD-10-CM | POA: Diagnosis not present

## 2017-06-30 DIAGNOSIS — M461 Sacroiliitis, not elsewhere classified: Secondary | ICD-10-CM | POA: Diagnosis not present

## 2017-06-30 DIAGNOSIS — M9905 Segmental and somatic dysfunction of pelvic region: Secondary | ICD-10-CM | POA: Diagnosis not present

## 2017-09-22 DIAGNOSIS — M9903 Segmental and somatic dysfunction of lumbar region: Secondary | ICD-10-CM | POA: Diagnosis not present

## 2017-09-22 DIAGNOSIS — M461 Sacroiliitis, not elsewhere classified: Secondary | ICD-10-CM | POA: Diagnosis not present

## 2017-09-22 DIAGNOSIS — M5441 Lumbago with sciatica, right side: Secondary | ICD-10-CM | POA: Diagnosis not present

## 2017-09-22 DIAGNOSIS — M9905 Segmental and somatic dysfunction of pelvic region: Secondary | ICD-10-CM | POA: Diagnosis not present

## 2017-10-05 ENCOUNTER — Ambulatory Visit (INDEPENDENT_AMBULATORY_CARE_PROVIDER_SITE_OTHER): Payer: Medicare Other

## 2017-10-05 VITALS — BP 118/62 | HR 65 | Temp 98.3°F | Resp 14 | Ht 70.0 in | Wt 231.4 lb

## 2017-10-05 DIAGNOSIS — Z125 Encounter for screening for malignant neoplasm of prostate: Secondary | ICD-10-CM

## 2017-10-05 DIAGNOSIS — Z1159 Encounter for screening for other viral diseases: Secondary | ICD-10-CM | POA: Diagnosis not present

## 2017-10-05 DIAGNOSIS — Z0000 Encounter for general adult medical examination without abnormal findings: Secondary | ICD-10-CM

## 2017-10-05 DIAGNOSIS — E785 Hyperlipidemia, unspecified: Secondary | ICD-10-CM

## 2017-10-05 DIAGNOSIS — R7309 Other abnormal glucose: Secondary | ICD-10-CM | POA: Diagnosis not present

## 2017-10-05 DIAGNOSIS — E7800 Pure hypercholesterolemia, unspecified: Secondary | ICD-10-CM | POA: Diagnosis not present

## 2017-10-05 DIAGNOSIS — E041 Nontoxic single thyroid nodule: Secondary | ICD-10-CM

## 2017-10-05 LAB — COMPREHENSIVE METABOLIC PANEL
ALBUMIN: 4.7 g/dL (ref 3.5–5.2)
ALK PHOS: 103 U/L (ref 39–117)
ALT: 28 U/L (ref 0–53)
AST: 21 U/L (ref 0–37)
BUN: 21 mg/dL (ref 6–23)
CHLORIDE: 101 meq/L (ref 96–112)
CO2: 30 mEq/L (ref 19–32)
Calcium: 9.8 mg/dL (ref 8.4–10.5)
Creatinine, Ser: 1.04 mg/dL (ref 0.40–1.50)
GFR: 75.23 mL/min (ref >60.00)
Glucose, Bld: 125 mg/dL — ABNORMAL HIGH (ref 70–99)
POTASSIUM: 4.8 meq/L (ref 3.5–5.1)
SODIUM: 140 meq/L (ref 135–145)
Total Bilirubin: 0.9 mg/dL (ref 0.2–1.2)
Total Protein: 6.9 g/dL (ref 6.0–8.3)

## 2017-10-05 LAB — LIPID PANEL
CHOLESTEROL: 242 mg/dL — AB (ref 0–200)
HDL: 39.3 mg/dL (ref >39.00)
LDL CALC: 164 mg/dL — AB (ref 0–99)
NonHDL: 203.17
TRIGLYCERIDES: 197 mg/dL — AB (ref 0.0–149.0)
Total CHOL/HDL Ratio: 6
VLDL: 39.4 mg/dL (ref 0.0–40.0)

## 2017-10-05 LAB — CBC WITH DIFFERENTIAL/PLATELET
Basophils Absolute: 0.1 10*3/uL (ref 0.0–0.1)
Basophils Relative: 1.1 % (ref 0.0–3.0)
EOS PCT: 2.8 % (ref 0.0–5.0)
Eosinophils Absolute: 0.2 10*3/uL (ref 0.0–0.7)
HCT: 45.2 % (ref 39.0–52.0)
HEMOGLOBIN: 15.2 g/dL (ref 13.0–17.0)
Lymphocytes Relative: 23.3 % (ref 12.0–46.0)
Lymphs Abs: 1.5 10*3/uL (ref 0.7–4.0)
MCHC: 33.6 g/dL (ref 30.0–36.0)
MCV: 91.9 fl (ref 78.0–100.0)
MONO ABS: 0.5 10*3/uL (ref 0.1–1.0)
Monocytes Relative: 8.2 % (ref 3.0–12.0)
Neutro Abs: 4.2 10*3/uL (ref 1.4–7.7)
Neutrophils Relative %: 64.6 % (ref 43.0–77.0)
Platelets: 327 10*3/uL (ref 150.0–400.0)
RBC: 4.92 Mil/uL (ref 4.22–5.81)
RDW: 12.9 % (ref 11.5–15.5)
WBC: 6.5 10*3/uL (ref 4.0–10.5)

## 2017-10-05 LAB — HEMOGLOBIN A1C: Hgb A1c MFr Bld: 6 % (ref 4.6–6.5)

## 2017-10-05 LAB — PSA, MEDICARE: PSA: 4.21 ng/ml — ABNORMAL HIGH (ref 0.10–4.00)

## 2017-10-05 NOTE — Patient Instructions (Addendum)
  Mr. Coia , Thank you for taking time to come for your Medicare Wellness Visit. I appreciate your ongoing commitment to your health goals. Please review the following plan we discussed and let me know if I can assist you in the future.   Follow up as needed.    Bring a copy of your [place] and/or Living Will to be scanned into chart.  Have a great day!  These are the goals we discussed: Goals    . Weight loss      Increase walking exercise regimen; change routine Low carb diet; portion control Stay hydrated        This is a list of the screening recommended for you and due dates:  Health Maintenance  Topic Date Due  .  Hepatitis C: One time screening is recommended by Center for Disease Control  (CDC) for  adults born from [birth year] through 1967.   Sep 22, 1950  . Pneumonia vaccines (1 of 2 - PCV13) 08/07/2013  . Colon Cancer Screening  04/03/2017  . Tetanus Vaccine  04/12/2017  . Flu Shot  11/24/2017

## 2017-10-05 NOTE — Progress Notes (Signed)
Subjective:   Christopher Foster is a 67 y.o. male who presents for an Initial Medicare Annual Wellness Visit.  Review of Systems  No ROS.  Medicare Wellness Visit. Additional risk factors are reflected in the social history.  Cardiac Risk Factors include: advanced age (>62men, >59 women);male gender;obesity (BMI >30kg/m2)    Objective:    Today's Vitals   10/05/17 0903  BP: 118/62  Pulse: 65  Resp: 14  Temp: 98.3 F (36.8 C)  TempSrc: Oral  SpO2: 95%  Weight: 231 lb 6.4 oz (105 kg)  Height: 5\' 10"  (1.778 m)   Body mass index is 33.2 kg/m.  Advanced Directives 10/05/2017  Does Patient Have a Medical Advance Directive? Yes  Type of Paramedic of [place];Living will  Does patient want to make changes to medical advance directive? No - Patient declined  Copy of [place] in Chart? No - copy requested    Current Medications (verified) Outpatient Encounter Medications as of 10/05/2017  Medication Sig  . aspirin EC 81 MG tablet Take by mouth.  . Cinnamon 500 MG capsule Take by mouth.  . niacin (NIASPAN) 1000 MG CR tablet Take by mouth.  . Omega-3 Fatty Acids (FISH OIL) 1000 MG CAPS Take by mouth.  Marland Kitchen omeprazole (PRILOSEC) 40 MG capsule Take by mouth.  . SM MULTIPLE VITAMINS/IRON TABS Take by mouth.  . vitamin E 100 UNIT capsule Take by mouth.  . [DISCONTINUED] amoxicillin-clavulanate (AUGMENTIN) 875-125 MG tablet Take 1 tablet by mouth 2 (two) times daily.  . [DISCONTINUED] benzonatate (TESSALON) 100 MG capsule Take 1 capsule (100 mg total) by mouth 3 (three) times daily.  . [DISCONTINUED] HYDROcodone-homatropine (HYCODAN) 5-1.5 MG/5ML syrup Take 5 mLs by mouth every 8 (eight) hours as needed for cough.   No facility-administered encounter medications on file as of 10/05/2017.     Allergies (verified) Other   History: Past Medical History:  Diagnosis Date  . GERD (gastroesophageal reflux disease)   . Thyroid nodule    Past  Surgical History:  Procedure Laterality Date  . CHOLECYSTECTOMY  2013   Family History  Problem Relation Age of Onset  . Renal cancer Father   . Renal cancer Brother   . Throat cancer Brother    Social History   Socioeconomic History  . Marital status: Married    Spouse name: Not on file  . Number of children: Not on file  . Years of education: Not on file  . Highest education level: Not on file  Occupational History  . Not on file  Social Needs  . Financial resource strain: Not hard at all  . Food insecurity:    Worry: Never true    Inability: Never true  . Transportation needs:    Medical: No    Non-medical: No  Tobacco Use  . Smoking status: Never Smoker  . Smokeless tobacco: Never Used  Substance and Sexual Activity  . Alcohol use: Yes    Alcohol/week: 1.2 - 1.8 oz    Types: 2 - 3 Standard drinks or equivalent per week  . Drug use: No  . Sexual activity: Not on file  Lifestyle  . Physical activity:    Days per week: 4 days    Minutes per session: 40 min  . Stress: Not at all  Relationships  . Social connections:    Talks on phone: Not on file    Gets together: Not on file    Attends religious service: Not on  file    Active member of club or organization: Not on file    Attends meetings of clubs or organizations: Not on file    Relationship status: Not on file  Other Topics Concern  . Not on file  Social History Narrative  . Not on file   Tobacco Counseling Counseling given: Not Answered   Clinical Intake:  Pre-visit preparation completed: Yes  Pain : No/denies pain     Nutritional Status: BMI > 30  Obese Diabetes: No  How often do you need to have someone help you when you read instructions, pamphlets, or other written materials from your doctor or pharmacy?: 1 - Never  Interpreter Needed?: No     Activities of Daily Living In your present state of health, do you have any difficulty performing the following activities: 10/05/2017    Hearing? N  Vision? N  Difficulty concentrating or making decisions? N  Walking or climbing stairs? N  Dressing or bathing? N  Doing errands, shopping? N  Preparing Food and eating ? N  Using the Toilet? N  In the past six months, have you accidently leaked urine? N  Do you have problems with loss of bowel control? N  Managing your Medications? N  Managing your Finances? N  Housekeeping or managing your Housekeeping? N  Some recent data might be hidden     Immunizations and Health Maintenance Immunization History  Administered Date(s) Administered  . Influenza Whole 03/03/2007, 01/30/2008  . Influenza-Unspecified 02/07/2015, 12/30/2015  . Td 04/13/2007  . Zoster 10/01/2008   Health Maintenance Due  Topic Date Due  . Hepatitis C Screening  10/20/1950  . PNA vac Low Risk Adult (1 of 2 - PCV13) 08/07/2013  . COLONOSCOPY  04/03/2017  . TETANUS/TDAP  04/12/2017    Patient Care Team: Leone Haven, MD as PCP - General (Family Medicine)  Indicate any recent Medical Services you may have received from other than [facility] providers in the past year (date may be approximate).    Assessment:   This is a routine wellness examination for Christopher Foster.  The goal of the wellness visit is to assist the patient how to close the gaps in care and create a preventative care plan for the patient.   The roster of all physicians providing medical care to patient is listed in the Snapshot section of the chart.  Osteoporosis risk reviewed.    Safety issues reviewed; Smoke and carbon monoxide detectors in the home. No firearms in the home. Wears seatbelts when driving or riding with others. No violence in the home.  They do not have excessive sun exposure.  Discussed the need for sun protection: hats, long sleeves and the use of sunscreen if there is significant sun exposure.  Patient is alert, normal appearance, oriented to person/place/and time.  Correctly identified the president of the [place]  and recalls of 3/3 words. Performs simple calculations and can read correct time from watch face.  Displays appropriate judgement.  No new identified risk were noted.  No failures at ADL's or IADL's.    BMI- discussed the importance of a healthy diet, water intake and the benefits of aerobic exercise. Educational material provided.   24 hour diet recall: Low carb diet  Dental- every 6 months.  Eye- Visual acuity not assessed per patient preference since they have regular follow up with the ophthalmologist.  Wears corrective lenses.  Sleep patterns- Sleeps through the night without issues.   Fasting labs completed.   Pneumococcal vaccine  discussed.   Patient Concerns: None at this time. Follow up with PCP as needed.  Hearing/Vision screen Hearing Screening Comments: Patient is able to hear conversational tones without difficulty.  No issues reported.   Vision Screening Comments: Followed by Dr. Gloriann Loan Wears corrective lenses Annual visits Visual acuity not assessed per patient preference since they have regular follow up with the ophthalmologist  Dietary issues and exercise activities discussed: Current Exercise Habits: Home exercise routine, Type of exercise: walking, Intensity: Intense  Goals    . Weight loss      Increase walking exercise regimen; change routine Low carb diet; portion control Stay hydrated       Depression Screen PHQ 2/9 Scores 10/05/2017 10/11/2016 02/20/2015  PHQ - 2 Score 0 0 0    Fall Risk Fall Risk  10/05/2017 10/11/2016 02/20/2015  Falls in the past year? No No No   Cognitive Function: MMSE - Mini Mental State Exam 10/05/2017  Orientation to time 5  Orientation to Place 5  Registration 3  Attention/ Calculation 5  Recall 3  Language- name 2 objects 2  Language- repeat 1  Language- follow 3 step command 3  Language- read & follow direction 1  Write a sentence 1  Copy design 1  Total score 30        Screening Tests Health  Maintenance  Topic Date Due  . Hepatitis C Screening  [birth year]  . PNA vac Low Risk Adult (1 of 2 - PCV13) 08/07/2013  . COLONOSCOPY  04/03/2017  . TETANUS/TDAP  04/12/2017  . INFLUENZA VACCINE  11/24/2017      Plan:    End of life planning; Advance aging; Advanced directives discussed. Copy of current HCPOA/Living Will requested.    I have personally reviewed and noted the following in the patient's chart:   . Medical and social history . Use of alcohol, tobacco or illicit drugs  . Current medications and supplements . Functional ability and status . Nutritional status . Physical activity . Advanced directives . List of other physicians . Hospitalizations, surgeries, and ER visits in previous 12 months . Vitals . Screenings to include cognitive, depression, and falls . Referrals and appointments  In addition, I have reviewed and discussed with patient certain preventive protocols, quality metrics, and best practice recommendations. A written personalized care plan for preventive services as well as general preventive health recommendations were provided to patient.     Varney Biles, LPN   [id number]

## 2017-10-06 LAB — HEPATITIS C ANTIBODY
HEP C AB: NONREACTIVE
SIGNAL TO CUT-OFF: 0.01 (ref <1.00)

## 2017-10-07 ENCOUNTER — Telehealth: Payer: Self-pay

## 2017-10-07 DIAGNOSIS — R972 Elevated prostate specific antigen [PSA]: Secondary | ICD-10-CM

## 2017-10-07 NOTE — Telephone Encounter (Signed)
-----   Message from Leone Haven, MD sent at 10/06/2017  4:57 PM EDT ----- Noted.  Given that he does not want to see urology I would suggest we recheck this in 2 to 3 weeks.  Please place an order for a PSA for the diagnosis of elevated PSA.  Thanks.

## 2017-10-13 DIAGNOSIS — Z08 Encounter for follow-up examination after completed treatment for malignant neoplasm: Secondary | ICD-10-CM | POA: Diagnosis not present

## 2017-10-13 DIAGNOSIS — D225 Melanocytic nevi of trunk: Secondary | ICD-10-CM | POA: Diagnosis not present

## 2017-10-13 DIAGNOSIS — D2272 Melanocytic nevi of left lower limb, including hip: Secondary | ICD-10-CM | POA: Diagnosis not present

## 2017-10-13 DIAGNOSIS — Z85828 Personal history of other malignant neoplasm of skin: Secondary | ICD-10-CM | POA: Diagnosis not present

## 2017-10-13 DIAGNOSIS — D2271 Melanocytic nevi of right lower limb, including hip: Secondary | ICD-10-CM | POA: Diagnosis not present

## 2017-10-13 DIAGNOSIS — L570 Actinic keratosis: Secondary | ICD-10-CM | POA: Diagnosis not present

## 2017-10-13 DIAGNOSIS — X32XXXA Exposure to sunlight, initial encounter: Secondary | ICD-10-CM | POA: Diagnosis not present

## 2017-10-13 DIAGNOSIS — D2261 Melanocytic nevi of right upper limb, including shoulder: Secondary | ICD-10-CM | POA: Diagnosis not present

## 2017-10-13 DIAGNOSIS — L218 Other seborrheic dermatitis: Secondary | ICD-10-CM | POA: Diagnosis not present

## 2017-10-13 DIAGNOSIS — D2262 Melanocytic nevi of left upper limb, including shoulder: Secondary | ICD-10-CM | POA: Diagnosis not present

## 2017-10-26 ENCOUNTER — Other Ambulatory Visit (INDEPENDENT_AMBULATORY_CARE_PROVIDER_SITE_OTHER): Payer: Medicare Other

## 2017-10-26 DIAGNOSIS — R972 Elevated prostate specific antigen [PSA]: Secondary | ICD-10-CM | POA: Diagnosis not present

## 2017-10-26 LAB — PSA: PSA: 2.19 ng/mL (ref 0.10–4.00)

## 2017-10-31 ENCOUNTER — Encounter: Payer: Self-pay | Admitting: Family Medicine

## 2017-10-31 ENCOUNTER — Ambulatory Visit (INDEPENDENT_AMBULATORY_CARE_PROVIDER_SITE_OTHER): Payer: Medicare Other | Admitting: Family Medicine

## 2017-10-31 VITALS — BP 130/70 | HR 70 | Temp 98.5°F | Ht 70.0 in | Wt 233.8 lb

## 2017-10-31 DIAGNOSIS — E785 Hyperlipidemia, unspecified: Secondary | ICD-10-CM

## 2017-10-31 DIAGNOSIS — K219 Gastro-esophageal reflux disease without esophagitis: Secondary | ICD-10-CM | POA: Diagnosis not present

## 2017-10-31 DIAGNOSIS — Z1211 Encounter for screening for malignant neoplasm of colon: Secondary | ICD-10-CM

## 2017-10-31 DIAGNOSIS — E041 Nontoxic single thyroid nodule: Secondary | ICD-10-CM | POA: Diagnosis not present

## 2017-10-31 DIAGNOSIS — R7303 Prediabetes: Secondary | ICD-10-CM | POA: Diagnosis not present

## 2017-10-31 MED ORDER — ROSUVASTATIN CALCIUM 20 MG PO TABS: 20.0000 mg | ORAL_TABLET | Freq: Every day | ORAL | 3 refills | Status: DC

## 2017-10-31 NOTE — Progress Notes (Signed)
  [name], MD Phone: [phone]  Christopher Foster is a 67 y.o. male who presents today for f/u.  CC: hld, prediabetes, GERD  HYPERLIPIDEMIA Symptoms Chest pain on exertion:  no   Leg claudication:   no Not currently on prescription medication.  He does take niacin, fish oil, and red yeast rice.  He eats very little red meat and monitors his diet.  He does walk for exercise.  Prediabetes: He notes no polyuria or polydipsia.  Works on diet and exercise.    GERD:   Reflux symptoms: no   Abd pain: no   Blood in stool: no  Dysphagia: no   EGD: in past  Medication: taking omeprazole  He continues to follow with endocrinology for his history of thyroid cancer.  He sees them annually.  He is doing well with regards to this.     Social History   Tobacco Use  Smoking Status Never Smoker  Smokeless Tobacco Never Used     ROS see history of present illness  Objective  Physical Exam Vitals:   10/31/17 1130  BP: 130/70  Pulse: 70  Temp: 98.5 F (36.9 C)  SpO2: 97%    BP Readings from Last 3 Encounters:  10/31/17 130/70  10/05/17 118/62  05/02/17 138/84   Wt Readings from Last 3 Encounters:  10/31/17 233 lb 12.8 oz (106.1 kg)  10/05/17 231 lb 6.4 oz (105 kg)  05/02/17 240 lb (108.9 kg)    Physical Exam  Constitutional: No distress.  Cardiovascular: Normal rate, regular rhythm and normal heart sounds.  Pulmonary/Chest: Effort normal and breath sounds normal.  Musculoskeletal: He exhibits no edema.  Neurological: He is alert.  Skin: Skin is warm and dry. He is not diaphoretic.     Assessment/Plan: Please see individual problem list.  Prediabetes Encourage diet and exercise.   Thyroid nodule He will continue to follow with endocrinology.  Hyperlipidemia Discussed ASCVD risk score.  Advise starting statin therapy.  Will start on Crestor.  Discussed potential for muscle aches and liver dysfunction with this.  Return in 1 month for labs.  GERD  (gastroesophageal reflux disease) Asymptomatic.  He will trial coming off of the Prilosec by going every other day and then discontinuing in 1 month.  If he has recurrence of symptoms he can restart medication.  Orders Placed This Encounter  Procedures  . LDL cholesterol, direct    Standing Status:   Future    Standing Expiration Date:   11/01/2018  . Hepatic function panel    Standing Status:   Future    Standing Expiration Date:   11/01/2018    Meds ordered this encounter  Medications  . rosuvastatin (CRESTOR) 20 MG tablet    Sig: Take 1 tablet (20 mg total) by mouth daily.    Dispense:  90 tablet    Refill:  [place], MD [place]

## 2017-10-31 NOTE — Assessment & Plan Note (Signed)
Discussed ASCVD risk score.  Advise starting statin therapy.  Will start on Crestor.  Discussed potential for muscle aches and liver dysfunction with this.  Return in 1 month for labs.

## 2017-10-31 NOTE — Assessment & Plan Note (Signed)
He will continue to follow with endocrinology.

## 2017-10-31 NOTE — Patient Instructions (Addendum)
Nice to see you. Please continue to work on diet and exercise. Please try taking your Prilosec every other day for the next month and then discontinuing.  If you have recurrence of your reflux symptoms you can start back on it. We will start you on Crestor for your cholesterol.  Please discontinue the red yeast rice.  We will recheck your labs in 1 month.  If you have any muscle aches with this please let us know.

## 2017-10-31 NOTE — Assessment & Plan Note (Signed)
Encourage diet and exercise. 

## 2017-10-31 NOTE — Assessment & Plan Note (Signed)
Asymptomatic.  He will trial coming off of the Prilosec by going every other day and then discontinuing in 1 month.  If he has recurrence of symptoms he can restart medication.

## 2017-11-02 NOTE — Progress Notes (Signed)
Patient would like to be referred for a colonoscopy

## 2017-11-02 NOTE — Addendum Note (Signed)
Addended by: Leone Haven on: 11/02/2017 10:24 AM   Modules accepted: Orders

## 2017-11-02 NOTE — Progress Notes (Signed)
GI referral placed for colonoscopy. 

## 2017-11-07 ENCOUNTER — Other Ambulatory Visit: Payer: Medicare Other

## 2017-12-22 DIAGNOSIS — M9903 Segmental and somatic dysfunction of lumbar region: Secondary | ICD-10-CM | POA: Diagnosis not present

## 2017-12-22 DIAGNOSIS — M461 Sacroiliitis, not elsewhere classified: Secondary | ICD-10-CM | POA: Diagnosis not present

## 2017-12-22 DIAGNOSIS — M5441 Lumbago with sciatica, right side: Secondary | ICD-10-CM | POA: Diagnosis not present

## 2017-12-22 DIAGNOSIS — M9905 Segmental and somatic dysfunction of pelvic region: Secondary | ICD-10-CM | POA: Diagnosis not present

## 2018-01-24 DIAGNOSIS — Z23 Encounter for immunization: Secondary | ICD-10-CM | POA: Diagnosis not present

## 2018-02-01 DIAGNOSIS — C73 Malignant neoplasm of thyroid gland: Secondary | ICD-10-CM | POA: Diagnosis not present

## 2018-02-01 DIAGNOSIS — E890 Postprocedural hypothyroidism: Secondary | ICD-10-CM | POA: Diagnosis not present

## 2018-02-08 DIAGNOSIS — E890 Postprocedural hypothyroidism: Secondary | ICD-10-CM | POA: Diagnosis not present

## 2018-02-08 DIAGNOSIS — Z85850 Personal history of malignant neoplasm of thyroid: Secondary | ICD-10-CM | POA: Diagnosis not present

## 2018-02-08 DIAGNOSIS — E041 Nontoxic single thyroid nodule: Secondary | ICD-10-CM | POA: Diagnosis not present

## 2018-02-15 DIAGNOSIS — E041 Nontoxic single thyroid nodule: Secondary | ICD-10-CM | POA: Diagnosis not present

## 2018-02-15 DIAGNOSIS — Z85850 Personal history of malignant neoplasm of thyroid: Secondary | ICD-10-CM | POA: Diagnosis not present

## 2018-04-13 DIAGNOSIS — M5441 Lumbago with sciatica, right side: Secondary | ICD-10-CM | POA: Diagnosis not present

## 2018-04-13 DIAGNOSIS — M9903 Segmental and somatic dysfunction of lumbar region: Secondary | ICD-10-CM | POA: Diagnosis not present

## 2018-04-13 DIAGNOSIS — M9905 Segmental and somatic dysfunction of pelvic region: Secondary | ICD-10-CM | POA: Diagnosis not present

## 2018-04-13 DIAGNOSIS — M461 Sacroiliitis, not elsewhere classified: Secondary | ICD-10-CM | POA: Diagnosis not present

## 2018-05-08 ENCOUNTER — Ambulatory Visit (INDEPENDENT_AMBULATORY_CARE_PROVIDER_SITE_OTHER): Payer: Medicare Other | Admitting: Family Medicine

## 2018-05-08 ENCOUNTER — Encounter: Payer: Self-pay | Admitting: Family Medicine

## 2018-05-08 VITALS — BP 120/68 | HR 65 | Temp 97.8°F | Ht 70.0 in | Wt 235.6 lb

## 2018-05-08 DIAGNOSIS — E785 Hyperlipidemia, unspecified: Secondary | ICD-10-CM

## 2018-05-08 DIAGNOSIS — M545 Low back pain, unspecified: Secondary | ICD-10-CM | POA: Insufficient documentation

## 2018-05-08 DIAGNOSIS — R7303 Prediabetes: Secondary | ICD-10-CM | POA: Diagnosis not present

## 2018-05-08 DIAGNOSIS — R972 Elevated prostate specific antigen [PSA]: Secondary | ICD-10-CM | POA: Diagnosis not present

## 2018-05-08 DIAGNOSIS — Z1211 Encounter for screening for malignant neoplasm of colon: Secondary | ICD-10-CM | POA: Diagnosis not present

## 2018-05-08 DIAGNOSIS — K219 Gastro-esophageal reflux disease without esophagitis: Secondary | ICD-10-CM

## 2018-05-08 DIAGNOSIS — Z85850 Personal history of malignant neoplasm of thyroid: Secondary | ICD-10-CM | POA: Diagnosis not present

## 2018-05-08 LAB — HEMOGLOBIN A1C: Hgb A1c MFr Bld: 6.1 % (ref 4.6–6.5)

## 2018-05-08 LAB — COMPREHENSIVE METABOLIC PANEL
ALK PHOS: 86 U/L (ref 39–117)
ALT: 28 U/L (ref 0–53)
AST: 18 U/L (ref 0–37)
Albumin: 4.4 g/dL (ref 3.5–5.2)
BUN: 17 mg/dL (ref 6–23)
CO2: 28 mEq/L (ref 19–32)
CREATININE: 1.03 mg/dL (ref 0.40–1.50)
Calcium: 9.4 mg/dL (ref 8.4–10.5)
Chloride: 103 mEq/L (ref 96–112)
GFR: 75.94 mL/min (ref >60.00)
GLUCOSE: 122 mg/dL — AB (ref 70–99)
POTASSIUM: 4.6 meq/L (ref 3.5–5.1)
SODIUM: 140 meq/L (ref 135–145)
TOTAL PROTEIN: 6.7 g/dL (ref 6.0–8.3)
Total Bilirubin: 1.1 mg/dL (ref 0.2–1.2)

## 2018-05-08 LAB — LDL CHOLESTEROL, DIRECT: Direct LDL: 144 mg/dL

## 2018-05-08 LAB — PSA: PSA: 3.9 ng/mL (ref 0.10–4.00)

## 2018-05-08 NOTE — Patient Instructions (Signed)
Nice to see you. Please try to stay active and continue to monitor your diet. Please monitor your back and if it does not start to improve over the next several weeks please let us know. We will check lab work today and contact you with the results.

## 2018-05-08 NOTE — Assessment & Plan Note (Signed)
Follows with endocrinology.  Recent biopsy reportedly negative.  He will continue to see endocrinology.

## 2018-05-08 NOTE — Progress Notes (Signed)
[name], MD Phone: [phone]  Christopher Foster is a 68 y.o. male who presents today for follow-up.  CC: Hyperlipidemia, GERD, elevated PSA, thyroid cancer, low back pain  Hyperlipidemia: Patient is not taking Crestor.  He notes no chest pain or shortness of breath.  He tries to walk for exercise.  He is monitoring his diet and is trying to get more protein shakes.  GERD: Taking omeprazole.  He tried coming off of this though had recurrence of symptoms.  Since going back on this he has had no symptoms.  No reflux, abdominal pain, blood in stool, or dysphagia.  Elevated PSA: No family history of prostate cancer.  It trended up and then came back down to his baseline.  He is due for recheck.  History of thyroid cancer: Patient follows with endocrinology for this.  He had a recent biopsy of a thyroid nodule.  He reports the results were negative.  He notes they advised him he was good for a year.  Low back pain: Notes he threw out his back lifting some branches yesterday.  He notes no radiation.  No numbness or weakness.  No incontinence.  He has been taking some ibuprofen for it.  Social History   Tobacco Use  Smoking Status Never Smoker  Smokeless Tobacco Never Used     ROS see history of present illness  Objective  Physical Exam Vitals:   05/08/18 0808 05/08/18 0826  BP: 120/68   Pulse: 65   Temp: 97.8 F (36.6 C)   SpO2: 93% 99%    BP Readings from Last 3 Encounters:  05/08/18 120/68  10/31/17 130/70  10/05/17 118/62   Wt Readings from Last 3 Encounters:  05/08/18 235 lb 9.6 oz (106.9 kg)  10/31/17 233 lb 12.8 oz (106.1 kg)  10/05/17 231 lb 6.4 oz (105 kg)    Physical Exam Constitutional:      General: He is not in acute distress.    Appearance: He is not diaphoretic.  Cardiovascular:     Rate and Rhythm: Normal rate and regular rhythm.     Heart sounds: Normal heart sounds.  Pulmonary:     Effort: Pulmonary effort is normal.     Breath sounds:  Normal breath sounds.  Musculoskeletal:     Comments: No midline spine tenderness, no midline spine step-off, no muscular back tenderness  Skin:    General: Skin is warm and dry.  Neurological:     Mental Status: He is alert.     Comments: 5/5 strength bilateral quads, hamstrings, plantar flexion, and dorsiflexion, sensation to light touch intact bilateral lower extremities      Assessment/Plan: Please see individual problem list.  GERD (gastroesophageal reflux disease) Well-controlled.  Asymptomatic on medication.  He will continue omeprazole.  Prediabetes Check A1c.  Hyperlipidemia Not on any medication.  Will recheck LDL today.  Encouraged diet and exercise.  Offered dietary information though patient notes he has someone at home who can manage his diet.  History of thyroid cancer Follows with endocrinology.  Recent biopsy reportedly negative.  He will continue to see endocrinology.  Elevated PSA Previously elevated.  Trended down to normal.  Recheck today.  Low back pain Suspect muscular strain.  He will continue to monitor.  If not improving in the next several weeks he will let us know.   Health Maintenance: referred for colon cancer screening. Patient reports he got the flu vaccine and pneumonia vaccine recently.   Orders Placed This Encounter  Procedures  .  HgB A1c  . Comp Met (CMET)  . PSA  . Direct LDL  . Ambulatory referral to Gastroenterology    Referral Priority:   Routine    Referral Type:   Consultation    Referral Reason:   Specialty Services Required    Number of Visits Requested:   1    No orders of the defined types were placed in this encounter.    [name], MD [place]

## 2018-05-08 NOTE — Assessment & Plan Note (Signed)
Not on any medication.  Will recheck LDL today.  Encouraged diet and exercise.  Offered dietary information though patient notes he has someone at home who can manage his diet.

## 2018-05-08 NOTE — Assessment & Plan Note (Signed)
Suspect muscular strain.  He will continue to monitor.  If not improving in the next several weeks he will let us know.

## 2018-05-08 NOTE — Assessment & Plan Note (Signed)
Check A1c. 

## 2018-05-08 NOTE — Assessment & Plan Note (Signed)
Well-controlled.  Asymptomatic on medication.  He will continue omeprazole.

## 2018-05-08 NOTE — Assessment & Plan Note (Signed)
Previously elevated.  Trended down to normal.  Recheck today.

## 2018-05-09 ENCOUNTER — Other Ambulatory Visit: Payer: Self-pay

## 2018-05-09 DIAGNOSIS — M530 Cervicocranial syndrome: Secondary | ICD-10-CM | POA: Diagnosis not present

## 2018-05-09 DIAGNOSIS — Z1211 Encounter for screening for malignant neoplasm of colon: Secondary | ICD-10-CM

## 2018-05-09 DIAGNOSIS — M5136 Other intervertebral disc degeneration, lumbar region: Secondary | ICD-10-CM | POA: Diagnosis not present

## 2018-05-09 DIAGNOSIS — M9905 Segmental and somatic dysfunction of pelvic region: Secondary | ICD-10-CM | POA: Diagnosis not present

## 2018-05-09 DIAGNOSIS — M9903 Segmental and somatic dysfunction of lumbar region: Secondary | ICD-10-CM | POA: Diagnosis not present

## 2018-05-09 DIAGNOSIS — M461 Sacroiliitis, not elsewhere classified: Secondary | ICD-10-CM | POA: Diagnosis not present

## 2018-05-09 DIAGNOSIS — M9901 Segmental and somatic dysfunction of cervical region: Secondary | ICD-10-CM | POA: Diagnosis not present

## 2018-05-11 ENCOUNTER — Telehealth: Payer: Self-pay | Admitting: Family Medicine

## 2018-05-11 NOTE — Telephone Encounter (Signed)
Charted in result notes. 

## 2018-05-11 NOTE — Telephone Encounter (Signed)
Copied from [place] [phone]. Topic: Quick Communication - Lab Results (Clinic Use ONLY) >> May 11, 2018 10:50 AM Myriam Forehand, [place] wrote: Called patient to inform them of  lab results. When patient returns call, triage nurse may disclose results. >> May 11, 2018 11:08 AM Yvette Rack wrote: Pt called in for lab results. Pt requests call back. Cb# [phone]

## 2018-05-12 ENCOUNTER — Other Ambulatory Visit: Payer: Self-pay | Admitting: Family Medicine

## 2018-05-12 DIAGNOSIS — R972 Elevated prostate specific antigen [PSA]: Secondary | ICD-10-CM

## 2018-05-15 ENCOUNTER — Telehealth: Payer: Self-pay | Admitting: Gastroenterology

## 2018-05-15 NOTE — Telephone Encounter (Signed)
Pt  Has procedure scheduled for 05/18/18 he has not received his  Instructions or rx he is in the area and will stop by to pick them up

## 2018-05-15 NOTE — Telephone Encounter (Signed)
Instructions printed and rx provided to patient upon arrival to office.  Thanks [employer]

## 2018-05-17 ENCOUNTER — Telehealth: Payer: Self-pay | Admitting: Gastroenterology

## 2018-05-17 NOTE — Telephone Encounter (Signed)
Wife had question about prep for colonoscopy & Sharyn Lull address questions & concerns

## 2018-05-18 ENCOUNTER — Ambulatory Visit: Payer: Medicare Other | Admitting: Anesthesiology

## 2018-05-18 ENCOUNTER — Encounter: Payer: Self-pay | Admitting: Emergency Medicine

## 2018-05-18 ENCOUNTER — Ambulatory Visit
Admission: RE | Admit: 2018-05-18 | Discharge: 2018-05-18 | Disposition: A | Discharge status: Home / Self Care | Payer: Medicare Other | Attending: Gastroenterology | Admitting: Gastroenterology

## 2018-05-18 ENCOUNTER — Encounter
Admission: RE | Disposition: A | Discharge status: Home / Self Care | Payer: Self-pay | Source: Home / Self Care | Attending: Gastroenterology

## 2018-05-18 DIAGNOSIS — D122 Benign neoplasm of ascending colon: Secondary | ICD-10-CM

## 2018-05-18 DIAGNOSIS — K5790 Diverticulosis of intestine, part unspecified, without perforation or abscess without bleeding: Secondary | ICD-10-CM | POA: Diagnosis not present

## 2018-05-18 DIAGNOSIS — Z79899 Other long term (current) drug therapy: Secondary | ICD-10-CM | POA: Diagnosis not present

## 2018-05-18 DIAGNOSIS — Z7982 Long term (current) use of aspirin: Secondary | ICD-10-CM | POA: Insufficient documentation

## 2018-05-18 DIAGNOSIS — K219 Gastro-esophageal reflux disease without esophagitis: Secondary | ICD-10-CM | POA: Insufficient documentation

## 2018-05-18 DIAGNOSIS — K5730 Diverticulosis of large intestine without perforation or abscess without bleeding: Secondary | ICD-10-CM | POA: Diagnosis not present

## 2018-05-18 DIAGNOSIS — Z1211 Encounter for screening for malignant neoplasm of colon: Secondary | ICD-10-CM | POA: Diagnosis not present

## 2018-05-18 DIAGNOSIS — K635 Polyp of colon: Secondary | ICD-10-CM | POA: Diagnosis not present

## 2018-05-18 DIAGNOSIS — E785 Hyperlipidemia, unspecified: Secondary | ICD-10-CM | POA: Diagnosis not present

## 2018-05-18 DIAGNOSIS — D126 Benign neoplasm of colon, unspecified: Secondary | ICD-10-CM | POA: Diagnosis not present

## 2018-05-18 HISTORY — PX: COLONOSCOPY WITH PROPOFOL: SHX5780

## 2018-05-18 SURGERY — COLONOSCOPY WITH PROPOFOL
Anesthesia: General

## 2018-05-18 MED ORDER — SODIUM CHLORIDE 0.9 % IV SOLN
INTRAVENOUS | Status: DC
  Administered 2018-05-18: 1000 mL via INTRAVENOUS

## 2018-05-18 MED ORDER — PROPOFOL 500 MG/50ML IV EMUL
INTRAVENOUS | Status: DC | PRN
  Administered 2018-05-18: 120 ug/kg/min via INTRAVENOUS

## 2018-05-18 MED ORDER — EPHEDRINE SULFATE 50 MG/ML IJ SOLN
INTRAMUSCULAR | Status: AC
  Filled 2018-05-18: qty 1

## 2018-05-18 MED ORDER — EPHEDRINE SULFATE 50 MG/ML IJ SOLN
INTRAMUSCULAR | Status: DC | PRN
  Administered 2018-05-18: 5 mg via INTRAVENOUS
  Administered 2018-05-18: 10 mg via INTRAVENOUS

## 2018-05-18 MED ORDER — PROPOFOL 500 MG/50ML IV EMUL
INTRAVENOUS | Status: AC
  Filled 2018-05-18: qty 50

## 2018-05-18 MED ORDER — MIDAZOLAM HCL 2 MG/2ML IJ SOLN
INTRAMUSCULAR | Status: AC
  Filled 2018-05-18: qty 2

## 2018-05-18 MED ORDER — FENTANYL CITRATE (PF) 100 MCG/2ML IJ SOLN
INTRAMUSCULAR | Status: DC | PRN
  Administered 2018-05-18 (×2): 50 ug via INTRAVENOUS

## 2018-05-18 MED ORDER — MIDAZOLAM HCL 2 MG/2ML IJ SOLN
INTRAMUSCULAR | Status: DC | PRN
  Administered 2018-05-18: 2 mg via INTRAVENOUS

## 2018-05-18 MED ORDER — FENTANYL CITRATE (PF) 100 MCG/2ML IJ SOLN
INTRAMUSCULAR | Status: AC
  Filled 2018-05-18: qty 2

## 2018-05-18 NOTE — Anesthesia Procedure Notes (Signed)
Performed by: Cook-Martin, Carlie Corpus Pre-anesthesia Checklist: Patient identified, Emergency Drugs available, Suction available, Patient being monitored and Timeout performed Patient Re-evaluated:Patient Re-evaluated prior to induction Oxygen Delivery Method: Nasal cannula Preoxygenation: Pre-oxygenation with 100% oxygen Induction Type: IV induction Placement Confirmation: positive ETCO2 and CO2 detector       

## 2018-05-18 NOTE — Anesthesia Postprocedure Evaluation (Signed)
Anesthesia Post Note  Patient: Christopher Foster  Procedure(s) Performed: COLONOSCOPY WITH PROPOFOL (N/A )  Patient location during evaluation: PACU Anesthesia Type: General Level of consciousness: awake and alert Pain management: pain level controlled Vital Signs Assessment: post-procedure vital signs reviewed and stable Respiratory status: spontaneous breathing, nonlabored ventilation, respiratory function stable and patient connected to nasal cannula oxygen Cardiovascular status: blood pressure returned to baseline and stable Postop Assessment: no apparent nausea or vomiting Anesthetic complications: no     Last Vitals:  Vitals:   05/18/18 0949 05/18/18 0959  BP: 104/63 110/64  Pulse: 97 84  Resp: 17 17  Temp:    SpO2: 94% 93%    Last Pain:  Vitals:   05/18/18 0959  TempSrc:   PainSc: 0-No pain                 Durenda Hurt

## 2018-05-18 NOTE — Anesthesia Preprocedure Evaluation (Addendum)
Anesthesia Evaluation  Patient identified by MRN, date of birth, ID band Patient awake    Reviewed: Allergy & Precautions, H&P , NPO status , Patient's Chart, lab work & pertinent test results  Airway Mallampati: III  TM Distance: <3 FB    Comment: Slightly limited mouth opening Dental  (+) Teeth Intact   Pulmonary neg pulmonary ROS, neg shortness of breath, neg COPD, neg recent URI,           Cardiovascular (-) angina(-) Past MI negative cardio ROS       Neuro/Psych negative neurological ROS  negative psych ROS   GI/Hepatic Neg liver ROS, GERD  Controlled,  Endo/Other  negative endocrine ROSThyroid nodule  Renal/GU   negative genitourinary   Musculoskeletal   Abdominal   Peds  Hematology negative hematology ROS (+)   Anesthesia Other Findings Obese  Past Medical History: No date: GERD (gastroesophageal reflux disease) No date: Thyroid nodule  Past Surgical History: 2013: CHOLECYSTECTOMY  BMI    Body Mass Index:  32.28 kg/m      Reproductive/Obstetrics negative OB ROS                            Anesthesia Physical Anesthesia Plan  ASA: III  Anesthesia Plan: General   Post-op Pain Management:    Induction:   PONV Risk Score and Plan: Propofol infusion and TIVA  Airway Management Planned:   Additional Equipment:   Intra-op Plan:   Post-operative Plan:   Informed Consent: I have reviewed the patients History and Physical, chart, labs and discussed the procedure including the risks, benefits and alternatives for the proposed anesthesia with the patient or authorized representative who has indicated his/her understanding and acceptance.     Dental Advisory Given  Plan Discussed with: Anesthesiologist and CRNA  Anesthesia Plan Comments:        Anesthesia Quick Evaluation

## 2018-05-18 NOTE — Transfer of Care (Signed)
Immediate Anesthesia Transfer of Care Note  Patient: Christopher Foster  Procedure(s) Performed: COLONOSCOPY WITH PROPOFOL (N/A )  Patient Location: PACU  Anesthesia Type:General  Level of Consciousness: awake and sedated  Airway & Oxygen Therapy: Patient Spontanous Breathing and Patient connected to face mask oxygen  Post-op Assessment: Report given to RN and Post -op Vital signs reviewed and stable  Post vital signs: Reviewed and stable  Last Vitals:  Vitals Value Taken Time  BP    Temp    Pulse    Resp    SpO2      Last Pain:  Vitals:   05/18/18 0817  TempSrc: Tympanic  PainSc: 0-No pain         Complications: No apparent anesthesia complications

## 2018-05-18 NOTE — H&P (Signed)
Christopher Bellows, MD [address] [place], [place], [place], [address] [address], [place], [place], [place], [address] Phone: [phone]  Fax: [phone]  Primary Care Physician:  Leone Haven, MD   Pre-Procedure History & Physical: HPI:  LEEVI Foster is a 68 y.o. male is here for an colonoscopy.   Past Medical History:  Diagnosis Date  . GERD (gastroesophageal reflux disease)   . Thyroid nodule     Past Surgical History:  Procedure Laterality Date  . CHOLECYSTECTOMY  2013    Prior to Admission medications   Medication Sig Start Date End Date Taking? Authorizing Provider  aspirin EC 81 MG tablet Take by mouth.   Yes [provider]  Cinnamon 500 MG capsule Take by mouth.   Yes [provider]  niacin (NIASPAN) 1000 MG CR tablet Take by mouth.   Yes [provider]  Omega-3 Fatty Acids (FISH OIL) 1000 MG CAPS Take by mouth.   Yes [provider]  omeprazole (PRILOSEC) 40 MG capsule Take by mouth.   Yes [provider]  SM MULTIPLE VITAMINS/IRON TABS Take by mouth.   Yes [provider]  vitamin E 100 UNIT capsule Take by mouth.   Yes [provider]    Allergies as of 05/10/2018 - Review Complete 05/08/2018  Allergen Reaction Noted  . Sulfa antibiotics Swelling and Other (See Comments) 02/05/2014    Family History  Problem Relation Age of Onset  . Renal cancer Father   . Renal cancer Brother   . Throat cancer Brother     Social History   Socioeconomic History  . Marital status: Married    Spouse name: Not on file  . Number of children: Not on file  . Years of education: Not on file  . Highest education level: Not on file  Occupational History  . Not on file  Social Needs  . Financial resource strain: Not hard at all  . Food insecurity:    Worry: Never true    Inability: Never true  . Transportation needs:    Medical: No    Non-medical: No  Tobacco Use  . Smoking status:  Never Smoker  . Smokeless tobacco: Never Used  Substance and Sexual Activity  . Alcohol use: Yes    Alcohol/week: 2.0 - 3.0 standard drinks    Types: 2 - 3 Standard drinks or equivalent per week  . Drug use: No  . Sexual activity: Not on file  Lifestyle  . Physical activity:    Days per week: 4 days    Minutes per session: 40 min  . Stress: Not at all  Relationships  . Social connections:    Talks on phone: Not on file    Gets together: Not on file    Attends religious service: Not on file    Active member of club or organization: Not on file    Attends meetings of clubs or organizations: Not on file    Relationship status: Not on file  . Intimate partner violence:    Fear of current or ex partner: No    Emotionally abused: No    Physically abused: No    Forced sexual activity: No  Other Topics Concern  . Not on file  Social History Narrative  . Not on file    Review of Systems: See HPI, otherwise negative ROS  Physical Exam: BP 110/64   Pulse 84   Temp (!) 97.4 F (36.3 C) (  Tympanic)   Resp 17   Ht 5\' 10"  (1.778 m)   Wt 102.1 kg   SpO2 93%   BMI 32.28 kg/m  General:   Alert,  pleasant and cooperative in NAD Head:  Normocephalic and atraumatic. Neck:  Supple; no masses or thyromegaly. Lungs:  Clear throughout to auscultation, normal respiratory effort.    Heart:  +S1, +S2, Regular rate and rhythm, No edema. Abdomen:  Soft, nontender and nondistended. Normal bowel sounds, without guarding, and without rebound.   Neurologic:  Alert and  oriented x4;  grossly normal neurologically.  Impression/Plan: Christopher Foster is here for an colonoscopy to be performed for Screening colonoscopy average risk   Risks, benefits, limitations, and alternatives regarding  colonoscopy have been reviewed with the patient.  Questions have been answered.  All parties agreeable.   Christopher Bellows, MD  05/18/2018, 11:27 AM

## 2018-05-18 NOTE — Anesthesia Post-op Follow-up Note (Signed)
Anesthesia QCDR form completed.        

## 2018-05-18 NOTE — Op Note (Signed)
[facility] Patient Name: Christopher Foster Procedure Date: 05/18/2018 8:44 AM MRN: [id number] Account #: [id number] Date of Birth: 23-Aug-1950 Admit Type: Outpatient Age: 68 Room: [facility] ENDO ROOM 3 Gender: Male Note Status: Finalized Procedure:            Colonoscopy Indications:          Screening for colorectal malignant neoplasm Providers:            Jonathon Bellows MD, MD Referring MD:         Angela Adam. Caryl Bis (Referring MD) Medicines:            Monitored Anesthesia Care Complications:        No immediate complications. Procedure:            Pre-Anesthesia Assessment:                       - Prior to the procedure, a History and Physical was                        performed, and patient medications, allergies and                        sensitivities were reviewed. The patient's tolerance of                        previous anesthesia was reviewed.                       - The risks and benefits of the procedure and the                        sedation options and risks were discussed with the                        patient. All questions were answered and informed                        consent was obtained.                       - ASA Grade Assessment: III - A patient with severe                        systemic disease.                       After obtaining informed consent, the colonoscope was                        passed under direct vision. Throughout the procedure,                        the patient's blood pressure, pulse, and oxygen                        saturations were monitored continuously. The                        Colonoscope was introduced through the anus and  advanced to the the cecum, identified by the                        appendiceal orifice, IC valve and transillumination.                        The colonoscopy was performed with ease. The patient                        tolerated the procedure well. The  quality of the bowel                        preparation was good. Findings:      The perianal and digital rectal examinations were normal.      Two sessile polyps were found in the ascending colon. The polyps were 4       to 5 mm in size. These polyps were removed with a cold snare. Resection       and retrieval were complete.      Multiple small-mouthed diverticula were found in the sigmoid colon.      The exam was otherwise without abnormality on direct and retroflexion       views. Impression:           - Two 4 to 5 mm polyps in the ascending colon, removed                        with a cold snare. Resected and retrieved.                       - Diverticulosis in the sigmoid colon.                       - The examination was otherwise normal on direct and                        retroflexion views. Recommendation:       - Discharge patient to home (with escort).                       - Resume previous diet.                       - Continue present medications.                       - Await pathology results.                       - Repeat colonoscopy in 5-10 years for surveillance                        based on pathology results. Procedure Code(s):    --- Professional ---                       [phone], Colonoscopy, flexible; with removal of tumor(s),                        polyp(s), or other lesion(s) by snare technique Diagnosis Code(s):    --- Professional ---  Z12.11, Encounter for screening for malignant neoplasm                        of colon                       D12.2, Benign neoplasm of ascending colon                       K57.30, Diverticulosis of large intestine without                        perforation or abscess without bleeding CPT copyright 2018 American Medical Association. All rights reserved. The codes documented in this report are preliminary and upon coder review may  be revised to meet current compliance requirements. Jonathon Bellows, MD Jonathon Bellows MD, MD 05/18/2018 9:36:46 AM This report has been signed electronically. Number of Addenda: 0 Note Initiated On: 05/18/2018 8:44 AM Scope Withdrawal Time: 0 hours 17 minutes 46 seconds  Total Procedure Duration: 0 hours 29 minutes 39 seconds       [facility]

## 2018-05-19 ENCOUNTER — Encounter: Payer: Self-pay | Admitting: Gastroenterology

## 2018-05-19 LAB — SURGICAL PATHOLOGY

## 2018-05-22 ENCOUNTER — Encounter: Payer: Self-pay | Admitting: Gastroenterology

## 2018-06-13 ENCOUNTER — Ambulatory Visit (INDEPENDENT_AMBULATORY_CARE_PROVIDER_SITE_OTHER): Payer: Medicare Other | Admitting: Urology

## 2018-06-13 ENCOUNTER — Encounter: Payer: Self-pay | Admitting: Urology

## 2018-06-13 VITALS — BP 128/73 | HR 70 | Ht 70.0 in | Wt 235.9 lb

## 2018-06-13 DIAGNOSIS — R972 Elevated prostate specific antigen [PSA]: Secondary | ICD-10-CM | POA: Diagnosis not present

## 2018-06-13 LAB — URINALYSIS, COMPLETE
Bilirubin, UA: NEGATIVE
Glucose, UA: NEGATIVE
Ketones, UA: NEGATIVE
Nitrite, UA: NEGATIVE
PH UA: 6.5 (ref 5.0–7.5)
PROTEIN UA: NEGATIVE
Specific Gravity, UA: 1.02 (ref 1.005–1.030)
Urobilinogen, Ur: 0.2 mg/dL (ref 0.2–1.0)

## 2018-06-13 NOTE — Progress Notes (Signed)
06/13/2018 3:51 PM   Christopher Foster [birth year] [id number]  Referring provider: Leone Haven, MD [address] [place], [place] [address]  CC: PSA screening  HPI: I saw Christopher Foster in urology clinic today in consultation for PSA screening.  He is a very healthy 68 year old male with no family history of prostate cancer who was recently found to have a PSA of 3.9 in January 2020.  This was slightly elevated from a PSA of 2.2 in July 2019, however stable from PSA of 4.2 in June 2019.  He also has a history of a PSA of 2.6 in 2010.  No prior history of prostate biopsy.  He denies any significant urinary symptoms, denies gross hematuria or flank pain.  Denies bone pain or weight loss.  His past surgical history is notable for a laparoscopic cholecystectomy, as well as an open appendectomy.  There are no aggravating or alleviating factors.  Severity is mild.  He owns a [employer].   PMH: Past Medical History:  Diagnosis Date  . GERD (gastroesophageal reflux disease)   . Thyroid nodule     Surgical History: Past Surgical History:  Procedure Laterality Date  . CHOLECYSTECTOMY  2013  . COLONOSCOPY WITH PROPOFOL N/A 05/18/2018   Procedure: COLONOSCOPY WITH PROPOFOL;  Surgeon: Christopher Bellows, MD;  Location: [facility] ENDOSCOPY;  Service: Gastroenterology;  Laterality: N/A;    Allergies:  Allergies  Allergen Reactions  . Sulfa Antibiotics Swelling and Other (See Comments)    Childhood allergy: Tongue swelling and skin peeled off hands Childhood allergy     Family History: Family History  Problem Relation Age of Onset  . Renal cancer Father   . Renal cancer Brother   . Throat cancer Brother     Social History:  reports that he has never smoked. He has never used smokeless tobacco. He reports current alcohol use of about 2.0 - 3.0 standard drinks of alcohol per week. He reports that he does not use drugs.  ROS: Please see flowsheet from today's date for complete  review of systems.  Physical Exam: BP 128/73 (BP Location: Left Arm, Patient Position: Sitting, Cuff Size: Normal)   Pulse 70   Ht 5\' 10"  (1.778 m)   Wt 235 lb 14.4 oz (107 kg)   BMI 33.85 kg/m    Constitutional:  Alert and oriented, No acute distress. Cardiovascular: No clubbing, cyanosis, or edema. Respiratory: Normal respiratory effort, no increased work of breathing. GI: Abdomen is soft, nontender, nondistended, no abdominal masses GU: No CVA tenderness DRE: 50 g, smooth, no nodules Lymph: No cervical or inguinal lymphadenopathy. Skin: No rashes, bruises or suspicious lesions. Neurologic: Grossly intact, no focal deficits, moving all 4 extremities. Psychiatric: Normal mood and affect.  Laboratory Data: PSA history 04/2018: 3.9 10/2017: 2.2 09/2017: 4.2 09/2008: 2.6  Urinalysis today 0 RBCs, 0 WBCs, no bacteria, nitrite negative  Pertinent Imaging: None to review.  Assessment & Plan:   In summary, the patient is a healthy 68 year old male with no family history of prostate cancer, normal digital rectal exam, and stable PSA of 3.9 who was referred to discuss further PSA screening.  In addition, his PSA is relatively stable from 2.6 ten years ago.  We reviewed the implications of PSA screening and the uncertainty surrounding it. In general, a man's PSA increases with age and is produced by both normal and cancerous prostate tissue. The differential diagnosis for elevated PSA includes BPH, prostate cancer, infection, recent intercourse/ejaculation, recent urethroscopic manipulation (foley placement/cystoscopy)  or trauma, and prostatitis.  We reviewed the AUA guidelines that recommend screening in men with no family history of prostate cancer from the ages of 92-69.  At the age of 39, the risks of PSA screening start outweigh the benefits, and routine screening is not recommended.  Management of an elevated PSA can include observation or prostate biopsy and we discussed this in  detail. Our goal is to detect clinically significant prostate cancers, and manage with either active surveillance, surgery, or radiation for localized disease. Risks of prostate biopsy include bleeding, infection (including life threatening sepsis), pain, and lower urinary symptoms. Hematuria, hematospermia, and blood in the stool are all common after biopsy and can persist up to 4 weeks.   We reviewed options moving forward for him including prostate biopsy, ongoing PSA screening, PSA free/total ratio, 4K score to better stratify risk, or discontinuation of PSA screening.  We discussed the very low, but not 0, risk of missing a clinically significant prostate cancer with discontinuation of PSA screening.  We also reviewed that normal PSA values for men in their 70s is less than 6.5.  He would like to defer any further PSA screening, this is certainly very reasonable  Follow-up as needed  Christopher Foster, [place] [address] [place], [place] [address] [phone]

## 2018-06-23 ENCOUNTER — Encounter: Payer: Self-pay | Admitting: Family Medicine

## 2018-06-23 ENCOUNTER — Ambulatory Visit (INDEPENDENT_AMBULATORY_CARE_PROVIDER_SITE_OTHER): Payer: Medicare Other | Admitting: Family Medicine

## 2018-06-23 VITALS — BP 128/70 | HR 84 | Temp 99.1°F | Resp 18 | Ht 70.0 in | Wt 236.2 lb

## 2018-06-23 DIAGNOSIS — J019 Acute sinusitis, unspecified: Secondary | ICD-10-CM | POA: Diagnosis not present

## 2018-06-23 MED ORDER — AMOXICILLIN-POT CLAVULANATE 875-125 MG PO TABS: 1.0000 | ORAL_TABLET | Freq: Two times a day (BID) | ORAL | 0 refills | Status: DC

## 2018-06-23 MED ORDER — FLUTICASONE PROPIONATE 50 MCG/ACT NA SUSP: 2.0000 | Freq: Every day | NASAL | 2 refills | Status: DC

## 2018-06-23 MED ORDER — CEFTRIAXONE SODIUM 1 G IJ SOLR: 1.0000 g | Freq: Once | INTRAMUSCULAR | Status: DC

## 2018-06-23 MED ORDER — METHYLPREDNISOLONE ACETATE 40 MG/ML IJ SUSP
40.0000 mg | Freq: Once | INTRAMUSCULAR | Status: AC
  Administered 2018-06-23: 40 mg via INTRAMUSCULAR

## 2018-06-23 NOTE — Patient Instructions (Signed)

## 2018-06-23 NOTE — Progress Notes (Signed)
   Subjective:    Patient ID: Christopher Foster, male    DOB: 1950-11-16, 68 y.o.   MRN: [id number]  HPI  Patient presents to clinic complaining of sinus congestion, sinus pain, thick nasal drainage, cough for almost 2 weeks.   Patient states he has been using Mucinex and ibuprofen with little effect in helping congestion, Mucinex does seem to help calm cough.  Denies shortness of breath or wheezing.  Denies chest pain.  Denies nausea, vomiting or diarrhea.  Denies fever or chills. Patient Active Problem List   Diagnosis Date Noted  . Elevated PSA 05/08/2018  . Low back pain 05/08/2018  . GERD (gastroesophageal reflux disease) 10/31/2017  . Obesity (BMI 30-39.9) 02/20/2015  . History of thyroid cancer 02/20/2015  . Hyperlipidemia 04/03/2007  . HIATAL HERNIA 04/03/2007  . Prediabetes 07/25/2005   Social History   Tobacco Use  . Smoking status: Never Smoker  . Smokeless tobacco: Never Used  Substance Use Topics  . Alcohol use: Yes    Alcohol/week: 2.0 - 3.0 standard drinks    Types: 2 - 3 Standard drinks or equivalent per week    Review of Systems  Constitutional: Negative for chills, fatigue and fever.  HENT: +sinus congestion, pressure, sinus pain, drainage of yellow Eyes: Negative.   Respiratory: Negative for shortness of breath and wheezing. +cough   Cardiovascular: Negative for chest pain, palpitations and leg swelling.  Gastrointestinal: Negative for abdominal pain, diarrhea, nausea and vomiting.  Genitourinary: Negative for dysuria, frequency and urgency.  Musculoskeletal: Negative for arthralgias and myalgias.  Skin: Negative for color change, pallor and rash.  Neurological: Negative for syncope, light-headedness and headaches.  Psychiatric/Behavioral: The patient is not nervous/anxious.       Objective:   Physical Exam Vitals signs and nursing note reviewed.  Constitutional:      General: He is not in acute distress.    Appearance: He is not toxic-appearing.    HENT:     Head: Normocephalic and atraumatic.     Ears:     Comments: Fullness bilateral TMs    Nose: Mucosal edema, congestion and rhinorrhea present.     Right Sinus: Maxillary sinus tenderness and frontal sinus tenderness present.     Left Sinus: Maxillary sinus tenderness and frontal sinus tenderness present.     Comments: +thick nasal drainge    Mouth/Throat:     Mouth: Mucous membranes are moist.     Pharynx: Posterior oropharyngeal erythema present. No oropharyngeal exudate.     Comments: +post nasal drip Eyes:     General: No scleral icterus.    Extraocular Movements: Extraocular movements intact.     Conjunctiva/sclera: Conjunctivae normal.  Neurological:     Mental Status: He is alert.    Vitals:   06/23/18 1025  BP: 128/70  Pulse: 84  Resp: 18  Temp: 99.1 F (37.3 C)  SpO2: 96%      Assessment & Plan:   Acute sinusitis - patient will take Augmentin twice daily to treat sinus infection.  IM steroid injection x1 given in clinic due to inflammation and swelling is seen in nasal mucosa.  Advised to use Flonase nasal spray to help reduce sinus congestion inflammation.  Advised to rest, keep up good fluid intake and do good handwashing.  Patient will keep regularly scheduled follow-up with PCP as planned.  Advised to return to clinic sooner if any issues arise.

## 2018-06-26 ENCOUNTER — Encounter: Payer: Self-pay | Admitting: Family Medicine

## 2018-06-26 ENCOUNTER — Telehealth: Payer: Self-pay | Admitting: Family Medicine

## 2018-06-26 DIAGNOSIS — J40 Bronchitis, not specified as acute or chronic: Secondary | ICD-10-CM

## 2018-06-26 MED ORDER — HYDROCODONE-HOMATROPINE 5-1.5 MG/5ML PO SYRP: 5.0000 mL | ORAL_SOLUTION | Freq: Every evening | ORAL | 0 refills | Status: DC | PRN

## 2018-06-26 NOTE — Telephone Encounter (Signed)
Call patient I see that he has had Hycodan cough syrup in the past.  This is generally pretty strong medication.  Has codeine in it which is a narcotic.  I am willing to prescribe that, I would advise him to take this only at bedtime.  He may not use alcohol while on this medication, nor take with other cough medications.   Let me know

## 2018-06-26 NOTE — Telephone Encounter (Signed)
Copied from [place] [id number]. Topic: Quick Communication - Rx Refill/Question >> Jun 26, 2018  9:15 AM Scherrie Gerlach wrote: Medication: a cough med  Pt seen by lauren on Friday and states he thought a cough med was to be called in, but it was not.  Pt state he needs the strongest Rx cough med available, as the OTC not working. Pt states he coughed all weekend. Advised Lauren out of the office today, and he is requesting someone call in for him today.  Pt's regular dr [id number] [address], [place] [phone] (Phone) [phone] (Fax)

## 2018-06-26 NOTE — Telephone Encounter (Signed)
I spoke with patient & advised on below. He has not had any adverse or side effects to this in the past. I have faxed for patient.

## 2018-09-07 DIAGNOSIS — H903 Sensorineural hearing loss, bilateral: Secondary | ICD-10-CM | POA: Diagnosis not present

## 2018-10-12 ENCOUNTER — Telehealth: Payer: Self-pay

## 2018-10-12 DIAGNOSIS — L570 Actinic keratosis: Secondary | ICD-10-CM | POA: Diagnosis not present

## 2018-10-12 DIAGNOSIS — L02426 Furuncle of left lower limb: Secondary | ICD-10-CM | POA: Diagnosis not present

## 2018-10-12 DIAGNOSIS — X32XXXA Exposure to sunlight, initial encounter: Secondary | ICD-10-CM | POA: Diagnosis not present

## 2018-10-12 DIAGNOSIS — L02425 Furuncle of right lower limb: Secondary | ICD-10-CM | POA: Diagnosis not present

## 2018-10-12 DIAGNOSIS — D2261 Melanocytic nevi of right upper limb, including shoulder: Secondary | ICD-10-CM | POA: Diagnosis not present

## 2018-10-12 DIAGNOSIS — D2262 Melanocytic nevi of left upper limb, including shoulder: Secondary | ICD-10-CM | POA: Diagnosis not present

## 2018-10-12 DIAGNOSIS — Z85828 Personal history of other malignant neoplasm of skin: Secondary | ICD-10-CM | POA: Diagnosis not present

## 2018-10-12 DIAGNOSIS — D2272 Melanocytic nevi of left lower limb, including hip: Secondary | ICD-10-CM | POA: Diagnosis not present

## 2018-10-12 NOTE — Telephone Encounter (Signed)
Copied from [place] [id number]. Topic: Appointment Scheduling - Scheduling Inquiry for Clinic >> Oct 12, 2018 12:41 PM Richardo Priest, [place] wrote: Reason for CRM: Patient called in wanting appointments for 6/19 to both be canceled. Patient refused to reschedule at this time and stated he would call back at a later date to reschedule appointments. Please advise and call back.

## 2018-10-13 ENCOUNTER — Ambulatory Visit: Payer: Medicare Other | Admitting: Family Medicine

## 2018-10-13 ENCOUNTER — Ambulatory Visit: Payer: Medicare Other

## 2018-10-13 ENCOUNTER — Other Ambulatory Visit: Payer: Self-pay

## 2018-10-13 ENCOUNTER — Telehealth: Payer: Self-pay

## 2018-10-13 NOTE — Telephone Encounter (Signed)
PCP called patient for scheduled follow up.  Health Coach called patient for scheduled AWV. No answer for both calls. No voice mail.  Appointments canceled. Reschedule as appropriate.

## 2018-10-13 NOTE — Progress Notes (Unsigned)
Subjective:   Christopher Foster is a 68 y.o. male who presents for an Initial Medicare Annual Wellness Visit.  Review of Systems  ***      Objective:    There were no vitals filed for this visit. There is no height or weight on file to calculate BMI.  Advanced Directives 05/18/2018 10/05/2017  Does Patient Have a Medical Advance Directive? Yes Yes  Type of Paramedic of [place];Living will [place];Living will  Does patient want to make changes to medical advance directive? - No - Patient declined  Copy of [place] in Chart? No - copy requested No - copy requested    Current Medications (verified) Outpatient Encounter Medications as of 10/13/2018  Medication Sig  . amoxicillin-clavulanate (AUGMENTIN) 875-125 MG tablet Take 1 tablet by mouth 2 (two) times daily.  Marland Kitchen aspirin EC 81 MG tablet Take by mouth.  . Cinnamon 500 MG capsule Take by mouth.  . fluticasone (FLONASE) 50 MCG/ACT nasal spray Place 2 sprays into both nostrils daily.  Marland Kitchen HYDROcodone-homatropine (HYCODAN) 5-1.5 MG/5ML syrup Take 5 mLs by mouth at bedtime as needed for cough.  . niacin (NIASPAN) 1000 MG CR tablet Take by mouth.  . Omega-3 Fatty Acids (FISH OIL) 1000 MG CAPS Take by mouth.  Marland Kitchen omeprazole (PRILOSEC) 40 MG capsule Take by mouth.  . SM MULTIPLE VITAMINS/IRON TABS Take by mouth.  . vitamin E 100 UNIT capsule Take by mouth.   No facility-administered encounter medications on file as of 10/13/2018.     Allergies (verified) Sulfa antibiotics   History: Past Medical History:  Diagnosis Date  . GERD (gastroesophageal reflux disease)   . Thyroid nodule    Past Surgical History:  Procedure Laterality Date  . CHOLECYSTECTOMY  2013  . COLONOSCOPY WITH PROPOFOL N/A 05/18/2018   Procedure: COLONOSCOPY WITH PROPOFOL;  Surgeon: Jonathon Bellows, MD;  Location: [facility] ENDOSCOPY;  Service: Gastroenterology;  Laterality: N/A;   Family History  Problem  Relation Age of Onset  . Renal cancer Father   . Renal cancer Brother   . Throat cancer Brother    Social History   Socioeconomic History  . Marital status: Married    Spouse name: Not on file  . Number of children: Not on file  . Years of education: Not on file  . Highest education level: Not on file  Occupational History  . Not on file  Social Needs  . Financial resource strain: Not hard at all  . Food insecurity    Worry: Never true    Inability: Never true  . Transportation needs    Medical: No    Non-medical: No  Tobacco Use  . Smoking status: Never Smoker  . Smokeless tobacco: Never Used  Substance and Sexual Activity  . Alcohol use: Yes    Alcohol/week: 2.0 - 3.0 standard drinks    Types: 2 - 3 Standard drinks or equivalent per week  . Drug use: No  . Sexual activity: Not on file  Lifestyle  . Physical activity    Days per week: 4 days    Minutes per session: 40 min  . Stress: Not at all  Relationships  . Social Herbalist on phone: Not on file    Gets together: Not on file    Attends religious service: Not on file    Active member of club or organization: Not on file    Attends meetings of clubs or organizations:  Not on file    Relationship status: Not on file  Other Topics Concern  . Not on file  Social History Narrative  . Not on file   Tobacco Counseling Counseling given: Not Answered   Clinical Intake:                       Activities of Daily Living No flowsheet data found.   Immunizations and Health Maintenance Immunization History  Administered Date(s) Administered  . Influenza Whole 03/03/2007, 01/30/2008  . Influenza-Unspecified 02/07/2015, 12/30/2015  . Td 04/13/2007  . Zoster 10/01/2008   Health Maintenance Due  Topic Date Due  . PNA vac Low Risk Adult (1 of 2 - PCV13) 08/07/2013  . TETANUS/TDAP  04/12/2017    Patient Care Team: Leone Haven, MD as PCP - General (Family Medicine)  Indicate any  recent Medical Services you may have received from other than [facility] providers in the past year (date may be approximate).    Assessment:   This is a routine wellness examination for Christopher Foster.  Hearing/Vision screen No exam data present  Dietary issues and exercise activities discussed:    Goals    . Weight loss      Increase walking exercise regimen; change routine Low carb diet; portion control Stay hydrated       Depression Screen PHQ 2/9 Scores 05/08/2018 10/05/2017 10/11/2016 02/20/2015  PHQ - 2 Score 0 0 0 0    Fall Risk Fall Risk  05/08/2018 10/05/2017 10/11/2016 02/20/2015  Falls in the past year? 0 No No No  Number falls in past yr: 0 - - -  Injury with Fall? 0 - - -    Is the patient's home free of loose throw rugs in walkways, pet beds, electrical cords, etc?   {Blank single:19197::"yes","no"}      Grab bars in the bathroom? {Blank single:19197::"yes","no"}      Handrails on the stairs?   {Blank single:19197::"yes","no"}      Adequate lighting?   {Blank single:19197::"yes","no"}  Timed Get Up and Go performed: ***  Cognitive Function: MMSE - Mini Mental State Exam 10/05/2017  Orientation to time 5  Orientation to Place 5  Registration 3  Attention/ Calculation 5  Recall 3  Language- name 2 objects 2  Language- repeat 1  Language- follow 3 step command 3  Language- read & follow direction 1  Write a sentence 1  Copy design 1  Total score 30        Screening Tests Health Maintenance  Topic Date Due  . PNA vac Low Risk Adult (1 of 2 - PCV13) 08/07/2013  . TETANUS/TDAP  04/12/2017  . INFLUENZA VACCINE  11/25/2018  . COLONOSCOPY  05/19/2023  . Hepatitis C Screening  Completed    Qualifies for Shingles Vaccine? ***  Cancer Screenings: Lung: Low Dose CT Chest recommended if Age 59-80 years, 30 pack-year currently smoking OR have quit w/in 15years. Patient {DOES NOT does:27190::"does not"} qualify. Colorectal: ***  Additional Screenings: *** Hepatitis C  Screening:   Health Screenings  Mammogram *** Pap Smear *** Colonoscopy *** PSA *** Bone Density *** Glaucoma *** Hearing  *** Hemoglobin A1C *** Cholesterol ***  Social  Alcohol intake *** Smoking history- *** Smokers in home?*** Illicit drug use?*** Exercise *** Diet *** Sexually Active *** Multiple Partners ***  Safety  Patient feesl safe at home. *** Patient does have smoke detectors at home *** Patient does wear sunscreen or protective clothing when in direct sunlight ***  Patient does wear seat belt when driving or riding with others. ***  Activities of Daily Living Patient can do their own household chores. Denies needing assistance with: driving, feeding themselves, getting from bed to chair, getting to the toilet, bathing/showering, dressing, managing money, climbing flight of stairs, or preparing meals.   Depression Screen Patient denies losing interest in daily life, feeling hopeless, or crying easily over simple problems. ***  Fall Screen Patient denies being afraid of falling or falling in the last year. *** IF YES- Did you trip? Were you light headed? Were you injured?   Memory Screen Patient denies problems with memory, misplacing items, and is able to balance checkbook/bank accounts. *** Patient is alert, normal appearance, oriented to person/place/and time. Correctly identified the president of the [place], recall of 3 objects, and performing simple calculations.  Patient displays appropriate judgement and can read correct time from watch face.   Immunizations The following Immunizations are up to date: Influenza, shingles, pneumonia, and tetanus.   Other Providers Patient Care Team: Leone Haven, MD as PCP - General (Family Medicine)      Plan:   ***  I have personally reviewed and noted the following in the patient's chart:   . Medical and social history . Use of alcohol, tobacco or illicit drugs  . Current medications and supplements  . Functional ability and status . Nutritional status . Physical activity . Advanced directives . List of other physicians . Hospitalizations, surgeries, and ER visits in previous 12 months . Vitals . Screenings to include cognitive, depression, and falls . Referrals and appointments  In addition, I have reviewed and discussed with patient certain preventive protocols, quality metrics, and best practice recommendations. A written personalized care plan for preventive services as well as general preventive health recommendations were provided to patient.     [name], MD   10/13/2018

## 2018-10-19 DIAGNOSIS — M5136 Other intervertebral disc degeneration, lumbar region: Secondary | ICD-10-CM | POA: Diagnosis not present

## 2018-10-19 DIAGNOSIS — M9901 Segmental and somatic dysfunction of cervical region: Secondary | ICD-10-CM | POA: Diagnosis not present

## 2018-10-19 DIAGNOSIS — M530 Cervicocranial syndrome: Secondary | ICD-10-CM | POA: Diagnosis not present

## 2018-10-19 DIAGNOSIS — M9905 Segmental and somatic dysfunction of pelvic region: Secondary | ICD-10-CM | POA: Diagnosis not present

## 2018-10-19 DIAGNOSIS — M461 Sacroiliitis, not elsewhere classified: Secondary | ICD-10-CM | POA: Diagnosis not present

## 2018-10-19 DIAGNOSIS — M9903 Segmental and somatic dysfunction of lumbar region: Secondary | ICD-10-CM | POA: Diagnosis not present

## 2018-11-08 DIAGNOSIS — H903 Sensorineural hearing loss, bilateral: Secondary | ICD-10-CM | POA: Diagnosis not present

## 2018-11-27 ENCOUNTER — Other Ambulatory Visit: Payer: Self-pay

## 2018-12-28 DIAGNOSIS — Z23 Encounter for immunization: Secondary | ICD-10-CM | POA: Diagnosis not present

## 2019-01-11 DIAGNOSIS — M5136 Other intervertebral disc degeneration, lumbar region: Secondary | ICD-10-CM | POA: Diagnosis not present

## 2019-01-11 DIAGNOSIS — M530 Cervicocranial syndrome: Secondary | ICD-10-CM | POA: Diagnosis not present

## 2019-01-11 DIAGNOSIS — M9905 Segmental and somatic dysfunction of pelvic region: Secondary | ICD-10-CM | POA: Diagnosis not present

## 2019-01-11 DIAGNOSIS — M461 Sacroiliitis, not elsewhere classified: Secondary | ICD-10-CM | POA: Diagnosis not present

## 2019-01-11 DIAGNOSIS — M9903 Segmental and somatic dysfunction of lumbar region: Secondary | ICD-10-CM | POA: Diagnosis not present

## 2019-01-11 DIAGNOSIS — M9901 Segmental and somatic dysfunction of cervical region: Secondary | ICD-10-CM | POA: Diagnosis not present

## 2019-02-16 DIAGNOSIS — Z85850 Personal history of malignant neoplasm of thyroid: Secondary | ICD-10-CM | POA: Diagnosis not present

## 2019-02-23 DIAGNOSIS — Z85850 Personal history of malignant neoplasm of thyroid: Secondary | ICD-10-CM | POA: Diagnosis not present

## 2019-02-23 DIAGNOSIS — E890 Postprocedural hypothyroidism: Secondary | ICD-10-CM | POA: Diagnosis not present

## 2019-03-06 DIAGNOSIS — M9903 Segmental and somatic dysfunction of lumbar region: Secondary | ICD-10-CM | POA: Diagnosis not present

## 2019-03-06 DIAGNOSIS — M9901 Segmental and somatic dysfunction of cervical region: Secondary | ICD-10-CM | POA: Diagnosis not present

## 2019-03-06 DIAGNOSIS — M461 Sacroiliitis, not elsewhere classified: Secondary | ICD-10-CM | POA: Diagnosis not present

## 2019-03-06 DIAGNOSIS — M5136 Other intervertebral disc degeneration, lumbar region: Secondary | ICD-10-CM | POA: Diagnosis not present

## 2019-03-06 DIAGNOSIS — M9905 Segmental and somatic dysfunction of pelvic region: Secondary | ICD-10-CM | POA: Diagnosis not present

## 2019-03-06 DIAGNOSIS — M530 Cervicocranial syndrome: Secondary | ICD-10-CM | POA: Diagnosis not present

## 2019-05-22 DIAGNOSIS — H25813 Combined forms of age-related cataract, bilateral: Secondary | ICD-10-CM | POA: Diagnosis not present

## 2019-07-12 DIAGNOSIS — M9901 Segmental and somatic dysfunction of cervical region: Secondary | ICD-10-CM | POA: Diagnosis not present

## 2019-07-12 DIAGNOSIS — M5136 Other intervertebral disc degeneration, lumbar region: Secondary | ICD-10-CM | POA: Diagnosis not present

## 2019-07-12 DIAGNOSIS — M461 Sacroiliitis, not elsewhere classified: Secondary | ICD-10-CM | POA: Diagnosis not present

## 2019-07-12 DIAGNOSIS — M530 Cervicocranial syndrome: Secondary | ICD-10-CM | POA: Diagnosis not present

## 2019-07-12 DIAGNOSIS — M9903 Segmental and somatic dysfunction of lumbar region: Secondary | ICD-10-CM | POA: Diagnosis not present

## 2019-07-12 DIAGNOSIS — M9905 Segmental and somatic dysfunction of pelvic region: Secondary | ICD-10-CM | POA: Diagnosis not present

## 2019-07-18 DIAGNOSIS — H903 Sensorineural hearing loss, bilateral: Secondary | ICD-10-CM | POA: Diagnosis not present

## 2019-08-20 ENCOUNTER — Telehealth: Payer: Self-pay | Admitting: Family Medicine

## 2019-08-20 NOTE — Telephone Encounter (Signed)
Left message for patient to call back and schedule Medicare Annual Wellness Visit (AWV) either virtually or audio only.  Last AWV 10/05/17; please schedule at anytime with Denisa O'Brien-Blaney at [facility].

## 2020-01-08 DIAGNOSIS — M461 Sacroiliitis, not elsewhere classified: Secondary | ICD-10-CM | POA: Diagnosis not present

## 2020-01-08 DIAGNOSIS — M9905 Segmental and somatic dysfunction of pelvic region: Secondary | ICD-10-CM | POA: Diagnosis not present

## 2020-01-08 DIAGNOSIS — M9903 Segmental and somatic dysfunction of lumbar region: Secondary | ICD-10-CM | POA: Diagnosis not present

## 2020-01-08 DIAGNOSIS — M9901 Segmental and somatic dysfunction of cervical region: Secondary | ICD-10-CM | POA: Diagnosis not present

## 2020-01-08 DIAGNOSIS — M5136 Other intervertebral disc degeneration, lumbar region: Secondary | ICD-10-CM | POA: Diagnosis not present

## 2020-01-08 DIAGNOSIS — M530 Cervicocranial syndrome: Secondary | ICD-10-CM | POA: Diagnosis not present

## 2020-02-15 DIAGNOSIS — E890 Postprocedural hypothyroidism: Secondary | ICD-10-CM | POA: Diagnosis not present

## 2020-02-15 DIAGNOSIS — Z85850 Personal history of malignant neoplasm of thyroid: Secondary | ICD-10-CM | POA: Diagnosis not present

## 2020-02-18 DIAGNOSIS — Z23 Encounter for immunization: Secondary | ICD-10-CM | POA: Diagnosis not present

## 2020-03-07 DIAGNOSIS — J00 Acute nasopharyngitis [common cold]: Secondary | ICD-10-CM | POA: Diagnosis not present

## 2020-03-07 DIAGNOSIS — Z20822 Contact with and (suspected) exposure to covid-19: Secondary | ICD-10-CM | POA: Diagnosis not present

## 2020-03-07 DIAGNOSIS — J309 Allergic rhinitis, unspecified: Secondary | ICD-10-CM | POA: Diagnosis not present

## 2020-03-07 DIAGNOSIS — J069 Acute upper respiratory infection, unspecified: Secondary | ICD-10-CM | POA: Diagnosis not present

## 2020-03-07 DIAGNOSIS — Z03818 Encounter for observation for suspected exposure to other biological agents ruled out: Secondary | ICD-10-CM | POA: Diagnosis not present

## 2020-03-07 DIAGNOSIS — R0981 Nasal congestion: Secondary | ICD-10-CM | POA: Diagnosis not present

## 2020-03-07 DIAGNOSIS — R059 Cough, unspecified: Secondary | ICD-10-CM | POA: Diagnosis not present

## 2020-03-26 DIAGNOSIS — Z85850 Personal history of malignant neoplasm of thyroid: Secondary | ICD-10-CM | POA: Diagnosis not present

## 2020-03-26 DIAGNOSIS — E890 Postprocedural hypothyroidism: Secondary | ICD-10-CM | POA: Diagnosis not present

## 2020-04-08 DIAGNOSIS — Z85850 Personal history of malignant neoplasm of thyroid: Secondary | ICD-10-CM | POA: Diagnosis not present

## 2020-04-08 DIAGNOSIS — E890 Postprocedural hypothyroidism: Secondary | ICD-10-CM | POA: Diagnosis not present

## 2020-09-29 ENCOUNTER — Telehealth: Payer: Self-pay

## 2020-09-29 ENCOUNTER — Telehealth: Payer: Self-pay | Admitting: Family Medicine

## 2020-09-29 NOTE — Telephone Encounter (Signed)
This patient had the Prevnar 13 on 01/04/2017 and his wife wants to know if he is due any mote pneumonia shots.  [name],cma

## 2020-09-29 NOTE — Telephone Encounter (Signed)
It appears he is due for the Pneumovax 23 vaccine.  I have also not seen him in 2 and half years.  He should be scheduled for a follow-up visit.  Thanks.

## 2020-09-29 NOTE — Telephone Encounter (Signed)
This appears to have been opened in error. Can you confirm this?

## 2020-09-29 NOTE — Telephone Encounter (Signed)
I called and spoke with the patient's wife and informed her that the patient did need a pneumonia vax 23  and he needed a follow up she stated when he comes home she will discuss and call back to schedule.  Attallah Ontko,cma

## 2020-09-29 NOTE — Telephone Encounter (Signed)
Patient's wife called needing to know the date of the patient's last pneumonia vaccine

## 2020-09-30 ENCOUNTER — Telehealth: Payer: Self-pay | Admitting: Family Medicine

## 2020-09-30 DIAGNOSIS — R7303 Prediabetes: Secondary | ICD-10-CM

## 2020-09-30 DIAGNOSIS — R972 Elevated prostate specific antigen [PSA]: Secondary | ICD-10-CM

## 2020-09-30 DIAGNOSIS — E785 Hyperlipidemia, unspecified: Secondary | ICD-10-CM

## 2020-09-30 NOTE — Telephone Encounter (Signed)
PTs wife would like to see if Gae Bon will contact her in regards to PT to see what vaccines and lab tests they can have done.

## 2020-10-02 NOTE — Telephone Encounter (Signed)
I called the patients wife and LVM to call back.  Zyiah Withington,cma

## 2020-10-03 NOTE — Telephone Encounter (Signed)
Pts wife called in to see if the PT needs to have labs done before the upcoming visit. Pts wife also wanted to know if we can give him the Shingrix shot [facility] since [facility]. Pts wife said you can text or email through [place] or call whatever one is easier to inform.

## 2020-10-03 NOTE — Telephone Encounter (Signed)
I called and spoke with the patients wife and informed her that due to medicare the patient would have to have the shingles vaccine at the pharmacy and after calling [facility] it is only $40. She stated they would get it there also patient will call back and schedule his labs prior to his appointment on 11/11/2020 so his labs need to be ordered.   Cliford Sequeira,cma

## 2020-10-05 NOTE — Addendum Note (Signed)
Addended by: Leone Haven on: 10/05/2020 07:18 PM   Modules accepted: Orders

## 2020-10-05 NOTE — Telephone Encounter (Signed)
Labs ordered.

## 2020-11-11 ENCOUNTER — Ambulatory Visit: Payer: Medicare Other | Admitting: Family Medicine

## 2022-04-05 ENCOUNTER — Other Ambulatory Visit: Payer: Self-pay | Admitting: Family Medicine

## 2022-04-05 DIAGNOSIS — Z9189 Other specified personal risk factors, not elsewhere classified: Secondary | ICD-10-CM

## 2022-04-16 ENCOUNTER — Ambulatory Visit
Admission: RE | Admit: 2022-04-16 | Discharge: 2022-04-16 | Disposition: A | Discharge status: Home / Self Care | Payer: Medicare PPO | Source: Ambulatory Visit | Attending: Family Medicine | Admitting: Family Medicine

## 2022-04-16 DIAGNOSIS — Z9189 Other specified personal risk factors, not elsewhere classified: Secondary | ICD-10-CM | POA: Insufficient documentation

## 2022-06-03 ENCOUNTER — Encounter: Payer: Self-pay | Admitting: Cardiovascular Disease

## 2022-06-03 ENCOUNTER — Ambulatory Visit: Payer: Medicare PPO | Attending: Cardiovascular Disease | Admitting: Cardiovascular Disease

## 2022-06-03 VITALS — BP 152/80 | HR 72 | Ht 70.0 in | Wt 229.0 lb

## 2022-06-03 DIAGNOSIS — E785 Hyperlipidemia, unspecified: Secondary | ICD-10-CM | POA: Diagnosis not present

## 2022-06-03 DIAGNOSIS — R931 Abnormal findings on diagnostic imaging of heart and coronary circulation: Secondary | ICD-10-CM

## 2022-06-03 NOTE — Patient Instructions (Signed)
Medication Instructions:  No changes *If you need a refill on your cardiac medications before your next appointment, please call your pharmacy*   Lab Work: None ordered If you have labs (blood work) drawn today and your tests are completely normal, you will receive your results only by: Nord (if you have MyChart) OR A paper copy in the mail If you have any lab test that is abnormal or we need to change your treatment, we will call you to review the results.   Testing/Procedures: None ordered   Follow-Up: At [facility], you and your health needs are our priority.  As part of our continuing mission to provide you with exceptional heart care, we have created designated Provider Care Teams.  These Care Teams include your primary Cardiologist (physician) and Advanced Practice Providers (APPs -  Physician Assistants and Nurse Practitioners) who all work together to provide you with the care you need, when you need it.  We recommend signing up for the patient portal called "MyChart".  Sign up information is provided on this After Visit Summary.  MyChart is used to connect with patients for Virtual Visits (Telemedicine).  Patients are able to view lab/test results, encounter notes, upcoming appointments, etc.  Non-urgent messages can be sent to your provider as well.   To learn more about what you can do with MyChart, go to [email or web address].    Your next appointment:   12 month(s)  Provider:   You may see Dr. Fletcher Anon or one of the following Advanced Practice Providers on your designated Care Team:   Murray Hodgkins, NP Christell Faith, PA-C Cadence Kathlen Mody, PA-C Gerrie Nordmann, NP    Other Instructions Heart-Healthy Eating Plan Many factors influence your heart health, including eating and exercise habits. Heart health is also called coronary health. Coronary risk increases with abnormal blood fat (lipid) levels. A heart-healthy eating plan includes limiting unhealthy  fats, increasing healthy fats, limiting salt (sodium) intake, and making other diet and lifestyle changes. What is my plan? Your health care provider may recommend that: You limit your fat intake to _________% or less of your total calories each day. You limit your saturated fat intake to _________% or less of your total calories each day. You limit the amount of cholesterol in your diet to less than _________ mg per day. You limit the amount of sodium in your diet to less than _________ mg per day. What are tips for following this plan? Cooking Cook foods using methods other than frying. Baking, boiling, grilling, and broiling are all good options. Other ways to reduce fat include: Removing the skin from poultry. Removing all visible fats from meats. Steaming vegetables in water or broth. Meal planning  At meals, imagine dividing your plate into fourths: Fill one-half of your plate with vegetables and green salads. Fill one-fourth of your plate with whole grains. Fill one-fourth of your plate with lean protein foods. Eat 2-4 cups of vegetables per day. One cup of vegetables equals 1 cup (91 g) broccoli or cauliflower florets, 2 medium carrots, 1 large bell pepper, 1 large sweet potato, 1 large tomato, 1 medium white potato, 2 cups (150 g) raw leafy greens. Eat 1-2 cups of fruit per day. One cup of fruit equals 1 small apple, 1 large banana, 1 cup (237 g) mixed fruit, 1 large orange,  cup (82 g) dried fruit, 1 cup (240 mL) 100% fruit juice. Eat more foods that contain soluble fiber. Examples include apples, broccoli, carrots, beans, peas, and  barley. Aim to get 25-30 g of fiber per day. Increase your consumption of legumes, nuts, and seeds to 4-5 servings per week. One serving of dried beans or legumes equals  cup (90 g) cooked, 1 serving of nuts is  oz (12 almonds, 24 pistachios, or 7 walnut halves), and 1 serving of seeds equals  oz (8 g). Fats Choose healthy fats more often. Choose  monounsaturated and polyunsaturated fats, such as olive and canola oils, avocado oil, flaxseeds, walnuts, almonds, and seeds. Eat more omega-3 fats. Choose salmon, mackerel, sardines, tuna, flaxseed oil, and ground flaxseeds. Aim to eat fish at least 2 times each week. Check food labels carefully to identify foods with trans fats or high amounts of saturated fat. Limit saturated fats. These are found in animal products, such as meats, butter, and cream. Plant sources of saturated fats include palm oil, palm kernel oil, and coconut oil. Avoid foods with partially hydrogenated oils in them. These contain trans fats. Examples are stick margarine, some tub margarines, cookies, crackers, and other baked goods. Avoid fried foods. General information Eat more home-cooked food and less restaurant, buffet, and fast food. Limit or avoid alcohol. Limit foods that are high in added sugar and simple starches such as foods made using white refined flour (white breads, pastries, sweets). Lose weight if you are overweight. Losing just 5-10% of your body weight can help your overall health and prevent diseases such as diabetes and heart disease. Monitor your sodium intake, especially if you have high blood pressure. Talk with your health care provider about your sodium intake. Try to incorporate more vegetarian meals weekly. What foods should I eat? Fruits All fresh, canned (in natural juice), or frozen fruits. Vegetables Fresh or frozen vegetables (raw, steamed, roasted, or grilled). Green salads. Grains Most grains. Choose whole wheat and whole grains most of the time. Rice and pasta, including brown rice and pastas made with whole wheat. Meats and other proteins Lean, well-trimmed beef, veal, pork, and lamb. Chicken and [place] without skin. All fish and shellfish. Wild duck, rabbit, pheasant, and venison. Egg whites or low-cholesterol egg substitutes. Dried beans, peas, lentils, and tofu. Seeds and most  nuts. Dairy Low-fat or nonfat cheeses, including ricotta and mozzarella. Skim or 1% milk (liquid, powdered, or evaporated). Buttermilk made with low-fat milk. Nonfat or low-fat yogurt. Fats and oils Non-hydrogenated (trans-free) margarines. Vegetable oils, including soybean, sesame, sunflower, olive, avocado, peanut, safflower, corn, canola, and cottonseed. Salad dressings or mayonnaise made with a vegetable oil. Beverages Water (mineral or sparkling). Coffee and tea. Unsweetened ice tea. Diet beverages. Sweets and desserts Sherbet, gelatin, and fruit ice. Small amounts of dark chocolate. Limit all sweets and desserts. Seasonings and condiments All seasonings and condiments. The items listed above may not be a complete list of foods and beverages you can eat. Contact a dietitian for more options. What foods should I avoid? Fruits Canned fruit in heavy syrup. Fruit in cream or butter sauce. Fried fruit. Limit coconut. Vegetables Vegetables cooked in cheese, cream, or butter sauce. Fried vegetables. Grains Breads made with saturated or trans fats, oils, or whole milk. Croissants. Sweet rolls. Donuts. High-fat crackers, such as cheese crackers and chips. Meats and other proteins Fatty meats, such as hot dogs, ribs, sausage, bacon, rib-eye roast or steak. High-fat deli meats, such as salami and bologna. Caviar. Domestic duck and goose. Organ meats, such as liver. Dairy Cream, sour cream, cream cheese, and creamed cottage cheese. Whole-milk cheeses. Whole or 2% milk (liquid, evaporated, or condensed).  Whole buttermilk. Cream sauce or high-fat cheese sauce. Whole-milk yogurt. Fats and oils Meat fat, or shortening. Cocoa butter, hydrogenated oils, palm oil, coconut oil, palm kernel oil. Solid fats and shortenings, including bacon fat, salt pork, lard, and butter. Nondairy cream substitutes. Salad dressings with cheese or sour cream. Beverages Regular sodas and any drinks with added sugar. Sweets  and desserts Frosting. Pudding. Cookies. Cakes. Pies. Milk chocolate or white chocolate. Buttered syrups. Full-fat ice cream or ice cream drinks. The items listed above may not be a complete list of foods and beverages to avoid. Contact a dietitian for more information. Summary Heart-healthy meal planning includes limiting unhealthy fats, increasing healthy fats, limiting salt (sodium) intake and making other diet and lifestyle changes. Lose weight if you are overweight. Losing just 5-10% of your body weight can help your overall health and prevent diseases such as diabetes and heart disease. Focus on eating a balance of foods, including fruits and vegetables, low-fat or nonfat dairy, lean protein, nuts and legumes, whole grains, and heart-healthy oils and fats. This information is not intended to replace advice given to you by your health care provider. Make sure you discuss any questions you have with your health care provider. Document Revised: 05/18/2021 Document Reviewed: 05/18/2021 Elsevier Patient Education  [place].

## 2022-06-03 NOTE — Progress Notes (Signed)
Cardiology Office Note   Date:  06/03/2022   ID:  Christopher Foster, DOB [birth year], MRN [id number]  PCP:  Dion Body, MD  Cardiologist:   Kathlyn Sacramento, MD   Chief Complaint  Patient presents with   NEW patient-Referred by Dr. Netty Starring for abnormal CT      History of Present Illness: Christopher Foster is a 72 y.o. male who was referred by Dr. Netty Starring for evaluation for abnormal coronary calcium score. He has past medical history of GERD, thyroid cancer status post partial thyroidectomy, hyperlipidemia, borderline diabetes mellitus and elevated PSA. He is a lifelong non-smoker.  Family history is negative for premature coronary artery disease. His calcium score was 101 mostly in the LAD distribution. He denies any chest pain, shortness of breath or palpitations.  He is physically active overall with no exertional symptoms.  He plays golf on a regular basis and has no symptoms with activities of daily living.   Past Medical History:  Diagnosis Date   GERD (gastroesophageal reflux disease)    Thyroid nodule     Past Surgical History:  Procedure Laterality Date   CHOLECYSTECTOMY  2013   COLONOSCOPY WITH PROPOFOL N/A 05/18/2018   Procedure: COLONOSCOPY WITH PROPOFOL;  Surgeon: Jonathon Bellows, MD;  Location[facility] ENDOSCOPY;  Service: Gastroenterology;  Laterality: N/A;     Current Outpatient Medications  Medication Sig Dispense Refill   aspirin EC 81 MG tablet Take 81 mg by mouth daily.     Calcium Carbonate (CALCIUM 500 PO) Take by mouth daily.     Cholecalciferol (VITAMIN D-1000 MAX ST) 25 MCG (1000 UT) tablet Take 1,000 Units by mouth daily.     Coenzyme Q10 (CO Q 10 PO) Take by mouth daily.     fluticasone (FLONASE) 50 MCG/ACT nasal spray Place 2 sprays into both nostrils daily. (Patient taking differently: Place 2 sprays into both nostrils daily as needed.) 16 g 2   niacin (NIASPAN) 1000 MG CR tablet Take by mouth daily.     Omega-3 Fatty Acids (FISH OIL) 1000 MG CAPS  Take by mouth daily.     omeprazole (PRILOSEC) 40 MG capsule Take 40 mg by mouth daily.     Red Yeast Rice Extract (RED YEAST RICE PO) Take by mouth daily.     rosuvastatin (CRESTOR) 20 MG tablet Take 1 tablet by mouth at bedtime.     SM MULTIPLE VITAMINS/IRON TABS Take by mouth daily.     vitamin E 100 UNIT capsule Take by mouth daily.     No current facility-administered medications for this visit.    Allergies:   Sulfa antibiotics    Social History:  The patient  reports that he has never smoked. He has never used smokeless tobacco. He reports current alcohol use of about 2.0 - 3.0 standard drinks of alcohol per week. He reports that he does not use drugs.   Family History:  The patient's family history includes Renal cancer in his brother and father; Throat cancer in his brother.    ROS:  Please see the history of present illness.   Otherwise, review of systems are positive for none.   All other systems are reviewed and negative.    PHYSICAL EXAM: VS:  BP (!) 152/80 (BP Location: Right Arm, Patient Position: Sitting, Cuff Size: Large)   Pulse 72   Ht '5\' 10"'$  (1.778 m)   Wt 229 lb (103.9 kg)   SpO2 98%   BMI 32.86 kg/m  , BMI Body  mass index is 32.86 kg/m. GEN: Well nourished, well developed, in no acute distress  HEENT: normal  Neck: no JVD, carotid bruits, or masses Cardiac: RRR; no murmurs, rubs, or gallops,no edema  Respiratory:  clear to auscultation bilaterally, normal work of breathing GI: soft, nontender, nondistended, + BS MS: no deformity or atrophy  Skin: warm and dry, no rash Neuro:  Strength and sensation are intact Psych: euthymic mood, full affect   EKG:  EKG is ordered today. The ekg ordered today demonstrates normal sinus rhythm with no significant ST or T wave changes.   Recent Labs: No results found for requested labs within last 365 days.    Lipid Panel    Component Value Date/Time   CHOL 242 (H) 10/05/2017 0943   TRIG 197.0 (H) 10/05/2017  0943   HDL 39.30 10/05/2017 0943   CHOLHDL 6 10/05/2017 0943   VLDL 39.4 10/05/2017 0943   LDLCALC 164 (H) 10/05/2017 0943   LDLDIRECT 144.0 05/08/2018 0828      Wt Readings from Last 3 Encounters:  06/03/22 229 lb (103.9 kg)  06/23/18 236 lb 3.2 oz (107.1 kg)  06/13/18 235 lb 14.4 oz (107 kg)         06/03/2022    3:15 PM  PAD Screen  Previous PAD dx? No  Previous surgical procedure? No  Pain with walking? No  Feet/toe relief with dangling? No  Painful, non-healing ulcers? No  Extremities discolored? No      ASSESSMENT AND PLAN:  1.  Elevated coronary calcium score: His score was 101 which is mildly elevated.  I agree with low-dose aspirin and treatment of risk factors.  He has no symptoms suggestive of angina and thus there is no utility of stress testing.  I discussed with him the natural history and management of atherosclerosis.  We focused on prevention of progression.  I recommended heart healthy diet and exercising at least 150 minutes/week.  2.  Hyperlipidemia: I agree with increasing the dose of rosuvastatin to 20 mg once daily to try to get his LDL below 70.  I reviewed most recent lipid profile which showed an LDL of 83.  Since then, the dose of rosuvastatin was doubled.  3.  Elevated blood pressure without history of hypertension: His blood pressure is elevated today but I reviewed his blood pressure from the 2 most recent office visit and his blood pressure was in the normal range.  Continue to monitor for now.    Disposition:   FU with me in 1 year  Signed,  Kathlyn Sacramento, MD  06/03/2022 3:44 PM    [place]

## 2022-06-04 ENCOUNTER — Ambulatory Visit: Payer: Medicare PPO | Admitting: Cardiovascular Disease

## 2022-08-04 ENCOUNTER — Ambulatory Visit: Payer: Medicare PPO | Admitting: Urology

## 2022-08-04 ENCOUNTER — Encounter: Payer: Self-pay | Admitting: Urology

## 2022-08-04 VITALS — BP 137/80 | HR 61 | Ht 70.0 in | Wt 219.0 lb

## 2022-08-04 DIAGNOSIS — R972 Elevated prostate specific antigen [PSA]: Secondary | ICD-10-CM | POA: Diagnosis not present

## 2022-08-04 NOTE — Progress Notes (Signed)
08/04/22 9:20 AM   Christopher Foster 01-06-1951 [id number]  CC: Elevated PSA  HPI: Healthy 72 year old male who I saw in February 2020 for similar issues.  He has no family history of prostate cancer.  He has a history of variable PSA ranging from 2.2-4.2 from from July 2019 through January 2020.  DRE at that time was benign, and using shared decision making he opted to discontinue PSA screening per the guideline recommendations.  His PCP has continued to check his PSA.  PSA was stable at 3.78 in August 2022, 4.6 in December 2023, and 6.7(15% free) in March 2024.  He continues to deny any urinary symptoms, gross hematuria, or bone pain.   PMH: Past Medical History:  Diagnosis Date   GERD (gastroesophageal reflux disease)    Thyroid nodule     Surgical History: Past Surgical History:  Procedure Laterality Date   CHOLECYSTECTOMY  2013   COLONOSCOPY WITH PROPOFOL N/A 05/18/2018   Procedure: COLONOSCOPY WITH PROPOFOL;  Surgeon: Wyline Mood, MD;  Location: [facility] ENDOSCOPY;  Service: Gastroenterology;  Laterality: N/A;      Family History: Family History  Problem Relation Age of Onset   Renal cancer Father    Renal cancer Brother    Throat cancer Brother     Social History:  reports that he has never smoked. He has never been exposed to tobacco smoke. He has never used smokeless tobacco. He reports current alcohol use of about 2.0 - 3.0 standard drinks of alcohol per week. He reports that he does not use drugs.  Physical Exam: BP 137/80   Pulse 61   Ht 5\' 10"  (1.778 m)   Wt 219 lb (99.3 kg)   BMI 31.42 kg/m    Constitutional:  Alert and oriented, No acute distress. Cardiovascular: No clubbing, cyanosis, or edema. Respiratory: Normal respiratory effort, no increased work of breathing. GI: Abdomen is soft, nontender, nondistended, no abdominal masses  Laboratory Data: PSA history reviewed, see HPI  Assessment & Plan:   Healthy 72 year old male who I previously saw in  2020 when PSA had been variable but stable ranging from 2.2-4.2, DRE was benign, and using shared decision making he opted to discontinue PSA screening.  PCP has continued to check PSA, and values were 3.78 in August 2022, 4.6 in December 2023, and 6.7(15% free) in March 2024.  We reviewed the implications of an elevated PSA and the uncertainty surrounding it. In general, a man's PSA increases with age and is produced by both normal and cancerous prostate tissue. The differential diagnosis for elevated PSA includes BPH, prostate cancer, infection, recent intercourse/ejaculation, recent urethroscopic manipulation (foley placement/cystoscopy) or trauma, and prostatitis. Management of an elevated PSA can include observation or prostate biopsy and we discussed this in detail. Our goal is to detect clinically significant prostate cancers, and manage with either active surveillance, surgery, or radiation for localized disease. Risks of prostate biopsy include bleeding, infection (including life threatening sepsis), pain, and lower urinary symptoms. Hematuria, hematospermia, and blood in the stool are all common after biopsy and can persist up to 4 weeks.   We again had a very long conversation regarding the AUA guidelines and the risks and benefits of screening.  We reviewed options including a repeat PSA in 3 months, 4K score, prostate MRI, or prostate biopsy.  Using shared decision making, he would like to repeat a PSA in 3 months, and understands if the PSA continues to rise we would recommend considering more invasive testing like prostate  MRI or biopsy.  However, if PSA is stable to decrease, could consider discontinuing screening per the guideline recommendations.  Legrand Rams, MD 08/04/2022  [facility] [address], [address] [place], [place] [address] [phone]

## 2022-08-04 NOTE — Patient Instructions (Signed)
Prostate Cancer Screening  Prostate cancer screening is testing that is done to check for the presence of prostate cancer in men. The prostate gland is a walnut-sized gland that is located below the bladder and in front of the rectum in males. The function of the prostate is to add fluid to semen during ejaculation. Prostate cancer is one of the most common types of cancer in men. Who should have prostate cancer screening? Screening recommendations vary based on age and other risk factors, as well as between the professional organizations who make the recommendations. In general, screening is recommended if: You are age 50 to 70 and have an average risk for prostate cancer. You should talk with your health care provider about your need for screening and how often screening should be done. Because most prostate cancers are slow growing and will not cause death, screening in this age group is generally reserved for men who have a 10- to 15-year life expectancy. You are younger than age 50, and you have these risk factors: Having a father, brother, or uncle who has been diagnosed with prostate cancer. The risk is higher if your family member's cancer occurred at an early age or if you have multiple family members with prostate cancer at an early age. Being a male who is Black or is of [place] or sub-Saharan African descent. In general, screening is not recommended if: You are younger than age 40. You are between the ages of 40 and 49 and you have no risk factors. You are 70 years of age or older. At this age, the risks that screening can cause are greater than the benefits that it may provide. If you are at high risk for prostate cancer, your health care provider may recommend that you have screenings more often or that you start screening at a younger age. How is screening for prostate cancer done? The recommended prostate cancer screening test is a blood test called the prostate-specific antigen  (PSA) test. PSA is a protein that is made in the prostate. As you age, your prostate naturally produces more PSA. Abnormally high PSA levels may be caused by: Prostate cancer. An enlarged prostate that is not caused by cancer (benign prostatic hyperplasia, or BPH). This condition is very common in older men. A prostate gland infection (prostatitis) or urinary tract infection. Certain medicines such as male hormones (like testosterone) or other medicines that raise testosterone levels. A rectal exam may be done as part of prostate cancer screening to help provide information about the size of your prostate gland. When a rectal exam is performed, it should be done after the PSA level is drawn to avoid any effect on the results. Depending on the PSA results, you may need more tests, such as: A physical exam to check the size of your prostate gland, if not done as part of screening. Blood and imaging tests. A procedure to remove tissue samples from your prostate gland for testing (biopsy). This is the only way to know for certain if you have prostate cancer. What are the benefits of prostate cancer screening? Screening can help to identify cancer at an early stage, before symptoms start and when the cancer can be treated more easily. There is a small chance that screening may lower your risk of dying from prostate cancer. The chance is small because prostate cancer is a slow-growing cancer, and most men with prostate cancer die from a different cause. What are the risks of prostate cancer screening? The   main risk of prostate cancer screening is diagnosing and treating prostate cancer that would never have caused any symptoms or problems. This is called overdiagnosisand overtreatment. PSA screening cannot tell you if your PSA is high due to cancer or a different cause. A prostate biopsy is the only procedure to diagnose prostate cancer. Even the results of a biopsy may not tell you if your cancer needs to  be treated. Slow-growing prostate cancer may not need any treatment other than monitoring, so diagnosing and treating it may cause unnecessary stress or other side effects. Questions to ask your health care provider When should I start prostate cancer screening? What is my risk for prostate cancer? How often do I need screening? What type of screening tests do I need? How do I get my test results? What do my results mean? Do I need treatment? Where to find more information The American Cancer Society: www.cancer.org American Urological Association: www.auanet.org Contact a health care provider if: You have difficulty urinating. You have pain when you urinate or ejaculate. You have blood in your urine or semen. You have pain in your back or in the area of your prostate. Summary Prostate cancer is a common type of cancer in men. The prostate gland is located below the bladder and in front of the rectum. This gland adds fluid to semen during ejaculation. Prostate cancer screening may identify cancer at an early stage, when the cancer can be treated more easily and is less likely to have spread to other areas of the body. The prostate-specific antigen (PSA) test is the recommended screening test for prostate cancer, but it has associated risks. Discuss the risks and benefits of prostate cancer screening with your health care provider. If you are age 31 or older, the risks that screening can cause are greater than the benefits that it may provide. This information is not intended to replace advice given to you by your health care provider. Make sure you discuss any questions you have with your health care provider. Document Revised: 10/06/2020 Document Reviewed: 10/06/2020 Elsevier Patient Education  2023 Else[employer] Inc.  Prostate-Specific Antigen Test Why am I having this test? The prostate-specific antigen (PSA) test is a screening test for prostate cancer. It can identify early signs of  prostate cancer, which may allow for early detection and more effective treatment. Your health care provider may recommend that you have a PSA test starting at age 89 or that you have one earlier if you are at higher risk for prostate cancer. You may also have a PSA test: To monitor treatment of prostate cancer. To check whether prostate cancer has returned after treatment. What is being tested? This test measures the amount of PSA in your blood. PSA is a protein that is made in the prostate. The prostate naturally produces more PSA as you age, but very high levels may be a sign of a medical condition. What kind of sample is taken?  A blood sample is required for this test. It is usually collected by inserting a needle into a blood vessel but can also be collected by sticking a finger with a small needle. Blood for this test should be drawn before having an exam of the prostate that involves digital rectal examination to avoid affecting the results. How do I prepare for this test? Do not ejaculate starting 72 hours before your test, or as long as told by your health care provider, as this can cause an elevation in PSA. Do not undergo  any procedures that require manipulation of the prostate, such as biopsy or surgery, for 6 weeks before the test is done as this can cause an elevation in PSA. Tell a health care provider about: Any signs you may have of other conditions that can affect PSA levels, such as: An enlarged prostate that is not caused by cancer (benign prostatic hyperplasia, or BPH). This condition is very common in older men. A prostate or urinary tract infection. Any allergies you have. All medicines you are taking, including vitamins, herbs, eye drops, creams, and over-the-counter medicines. This also includes: Medicines to assist with hair growth, such as finasteride. Any recent exposure to a medicine called diethylstilbestrol (DES). Medicines such as male hormones (like testosterone)  or other medicines that raise testosterone levels. Any bleeding problems you have. Any recent procedures you have had, especially any procedures involving the prostate or rectum. Any medical conditions you have. How are the results reported? Your test results will be reported as a value that indicates how much PSA is in your blood. This will be given as nanograms of PSA per milliliter of blood (ng/mL). Your health care provider will compare your results to normal ranges that were established after testing a large group of people (reference ranges). Reference ranges may vary among labs and hospitals. PSA levels vary from person to person and generally increase with age. Because of this variation, there is no single PSA value that is considered normal for everyone. Instead, PSA reference ranges are used to describe whether your PSA levels are considered low or high (elevated). Common reference ranges are: Low: 0-4ng/mL. Slightly to moderately elevated: 4-10.0 ng/mL. Moderately elevated: 10.0-19.9 ng/mL. Significantly elevated: 20 ng/mL or greater. What do the results mean? A test result that is higher than 4 ng/mL may mean that you have prostate cancer. However, a PSA test by itself is not enough to diagnose prostate cancer. High PSA levels may also be caused by the natural aging process, prostate infection (prostatitis), or BPH. PSA screening cannot tell you if your PSA is high due to cancer or a different cause. A prostate biopsy is the only way to diagnose prostate cancer. A risk of having the PSA test is diagnosing and treating prostate cancer that would never have caused any symptoms or problems (overdiagnosis and overtreatment). Talk with your health care provider about what your results mean. In some cases, your health care provider may do more testing to confirm the results. Questions to ask your health care provider Ask your health care provider, or the department that is doing the  test: When will my results be ready? How will I get my results? What are my treatment options? What other tests do I need? What are my next steps? Summary The prostate-specific antigen (PSA) test is a screening test for prostate cancer. Your health care provider may recommend that you have a PSA test starting at age 53 or that you have one earlier if you are at higher risk for prostate cancer. A test result that is higher than 4 ng/mL may mean that you have prostate cancer. However, elevated levels can be caused by a number of conditions other than prostate cancer. Talk with your health care provider about what your results mean. This information is not intended to replace advice given to you by your health care provider. Make sure you discuss any questions you have with your health care provider. Document Revised: 08/20/2020 Document Reviewed: 08/20/2020 Elsevier Patient Education  2023 [employer].

## 2022-11-03 ENCOUNTER — Other Ambulatory Visit: Payer: Medicare PPO

## 2022-11-03 DIAGNOSIS — R972 Elevated prostate specific antigen [PSA]: Secondary | ICD-10-CM

## 2022-11-04 LAB — PSA TOTAL (REFLEX TO FREE): Prostate Specific Ag, Serum: 6.2 ng/mL — ABNORMAL HIGH (ref 0.0–4.0)

## 2022-11-04 LAB — FPSA% REFLEX
% FREE PSA: 14.4 %
PSA, FREE: 0.89 ng/mL

## 2022-11-08 ENCOUNTER — Other Ambulatory Visit: Payer: Self-pay | Admitting: Family Medicine

## 2022-11-08 DIAGNOSIS — R1012 Left upper quadrant pain: Secondary | ICD-10-CM

## 2022-11-08 DIAGNOSIS — E7800 Pure hypercholesterolemia, unspecified: Secondary | ICD-10-CM

## 2022-11-10 ENCOUNTER — Ambulatory Visit: Payer: Medicare PPO | Admitting: Urology

## 2022-11-11 ENCOUNTER — Encounter: Payer: Self-pay | Admitting: Urology

## 2022-11-11 ENCOUNTER — Ambulatory Visit: Payer: Medicare PPO | Admitting: Urology

## 2022-11-11 VITALS — BP 147/82 | HR 59 | Ht 70.0 in | Wt 220.0 lb

## 2022-11-11 DIAGNOSIS — R972 Elevated prostate specific antigen [PSA]: Secondary | ICD-10-CM | POA: Diagnosis not present

## 2022-11-11 NOTE — Progress Notes (Signed)
   11/11/2022 8:36 AM   Sharyne Peach Aubery Lapping 26-Mar-1951 [id number]  Reason for visit: Follow up elevated PSA  HPI: Healthy 72 year old male with a history of a very slowly rising PSA.  I actually saw him in 2020 when PSA was normal at 3.9 and using shared decision making he opted to discontinue PSA screening per the guideline recommendations.  DRE was benign.  His PCP is continue to check PSA, and he has had a slow rise including 3.78 in August 2022, 4.31 March 2022, and 6.7(15% free) in March 2024.  At our visit on 08/04/2022 he opted for repeat PSA in 3 months, and this decreased to 6.2(14.4% free).  We had a long conversation this morning about the challenges of interpreting PSA for men in their 70s, limitations of screening, and again the guidelines that do not recommend routine screening in men over age 72.  Based on the PSA and percentage free, data would suggest a ~30% risk of prostate cancer, however it is unclear how many of those would be clinically significant cancers that could ultimately cause symptoms or death.  He actually has an upcoming CT abdomen and pelvis with contrast ordered by PCP for some left upper abdominal pain that is scheduled for 11/16/2022.  I recommended following up those images and I can calculate a PSA density which can help differentiate if this mild PSA elevation is related to BPH or prostate cancer.  If PSA density is concerning, he is willing to undergo prostate MRI, understandably he would like to avoid prostate biopsy if possible.  Follow-up CT abdomen and pelvis ordered by PCP on 7/23 and calculate PSA density, call with results, if concerning we will order prostate MRI    Sondra Come, MD  [facility] [address] [place], [place] [address] [phone]

## 2022-11-11 NOTE — Patient Instructions (Signed)
We will follow-up your upcoming CT scan and calculate a PSA density which can help sort out if this mild PSA elevation is from slightly enlarged prostate for something more worrisome like prostate cancer.  If concerning, we will set up a prostate MRI for further evaluation  Prostate Cancer Screening  Prostate cancer screening is testing that is done to check for the presence of prostate cancer in men. The prostate gland is a walnut-sized gland that is located below the bladder and in front of the rectum in males. The function of the prostate is to add fluid to semen during ejaculation. Prostate cancer is one of the most common types of cancer in men. Who should have prostate cancer screening? Screening recommendations vary based on age and other risk factors, as well as between the professional organizations who make the recommendations. In general, screening is recommended if: You are age 38 to 60 and have an average risk for prostate cancer. You should talk with your health care provider about your need for screening and how often screening should be done. Because most prostate cancers are slow growing and will not cause death, screening in this age group is generally reserved for men who have a 10- to 15-year life expectancy. You are younger than age 53, and you have these risk factors: Having a father, brother, or uncle who has been diagnosed with prostate cancer. The risk is higher if your family member's cancer occurred at an early age or if you have multiple family members with prostate cancer at an early age. Being a male who is [place] or is of [place] or sub-Saharan African descent. In general, screening is not recommended if: You are younger than age 67. You are between the ages of 29 and 80 and you have no risk factors. You are 36 years of age or older. At this age, the risks that screening can cause are greater than the benefits that it may provide. If you are at high risk for prostate  cancer, your health care provider may recommend that you have screenings more often or that you start screening at a younger age. How is screening for prostate cancer done? The recommended prostate cancer screening test is a blood test called the prostate-specific antigen (PSA) test. PSA is a protein that is made in the prostate. As you age, your prostate naturally produces more PSA. Abnormally high PSA levels may be caused by: Prostate cancer. An enlarged prostate that is not caused by cancer (benign prostatic hyperplasia, or BPH). This condition is very common in older men. A prostate gland infection (prostatitis) or urinary tract infection. Certain medicines such as male hormones (like testosterone) or other medicines that raise testosterone levels. A rectal exam may be done as part of prostate cancer screening to help provide information about the size of your prostate gland. When a rectal exam is performed, it should be done after the PSA level is drawn to avoid any effect on the results. Depending on the PSA results, you may need more tests, such as: A physical exam to check the size of your prostate gland, if not done as part of screening. Blood and imaging tests. A procedure to remove tissue samples from your prostate gland for testing (biopsy). This is the only way to know for certain if you have prostate cancer. What are the benefits of prostate cancer screening? Screening can help to identify cancer at an early stage, before symptoms start and when the cancer can be treated more  easily. There is a small chance that screening may lower your risk of dying from prostate cancer. The chance is small because prostate cancer is a slow-growing cancer, and most men with prostate cancer die from a different cause. What are the risks of prostate cancer screening? The main risk of prostate cancer screening is diagnosing and treating prostate cancer that would never have caused any symptoms or  problems. This is called overdiagnosisand overtreatment. PSA screening cannot tell you if your PSA is high due to cancer or a different cause. A prostate biopsy is the only procedure to diagnose prostate cancer. Even the results of a biopsy may not tell you if your cancer needs to be treated. Slow-growing prostate cancer may not need any treatment other than monitoring, so diagnosing and treating it may cause unnecessary stress or other side effects. Questions to ask your health care provider When should I start prostate cancer screening? What is my risk for prostate cancer? How often do I need screening? What type of screening tests do I need? How do I get my test results? What do my results mean? Do I need treatment? Where to find more information The American Cancer Society: www.cancer.org American Urological Association: www.auanet.org Contact a health care provider if: You have difficulty urinating. You have pain when you urinate or ejaculate. You have blood in your urine or semen. You have pain in your back or in the area of your prostate. Summary Prostate cancer is a common type of cancer in men. The prostate gland is located below the bladder and in front of the rectum. This gland adds fluid to semen during ejaculation. Prostate cancer screening may identify cancer at an early stage, when the cancer can be treated more easily and is less likely to have spread to other areas of the body. The prostate-specific antigen (PSA) test is the recommended screening test for prostate cancer, but it has associated risks. Discuss the risks and benefits of prostate cancer screening with your health care provider. If you are age 44 or older, the risks that screening can cause are greater than the benefits that it may provide. This information is not intended to replace advice given to you by your health care provider. Make sure you discuss any questions you have with your health care  provider. Document Revised: 10/06/2020 Document Reviewed: 10/06/2020 Elsevier Patient Education  2024 [employer].

## 2022-11-16 ENCOUNTER — Ambulatory Visit
Admission: RE | Admit: 2022-11-16 | Discharge: 2022-11-16 | Disposition: A | Discharge status: Home / Self Care | Payer: Medicare PPO | Source: Ambulatory Visit | Attending: Family Medicine | Admitting: Family Medicine

## 2022-11-16 DIAGNOSIS — E7800 Pure hypercholesterolemia, unspecified: Secondary | ICD-10-CM | POA: Diagnosis not present

## 2022-11-16 DIAGNOSIS — R1012 Left upper quadrant pain: Secondary | ICD-10-CM | POA: Insufficient documentation

## 2022-11-23 ENCOUNTER — Other Ambulatory Visit: Payer: Self-pay

## 2022-11-23 DIAGNOSIS — R972 Elevated prostate specific antigen [PSA]: Secondary | ICD-10-CM

## 2023-01-20 ENCOUNTER — Ambulatory Visit: Payer: Self-pay

## 2023-05-19 ENCOUNTER — Other Ambulatory Visit: Payer: Medicare PPO

## 2023-05-19 DIAGNOSIS — R972 Elevated prostate specific antigen [PSA]: Secondary | ICD-10-CM

## 2023-05-20 LAB — PSA TOTAL (REFLEX TO FREE): Prostate Specific Ag, Serum: 4.5 ng/mL — ABNORMAL HIGH (ref 0.0–4.0)

## 2023-05-20 LAB — FPSA% REFLEX
% FREE PSA: 22.2 %
PSA, FREE: 1 ng/mL

## 2023-05-26 ENCOUNTER — Ambulatory Visit: Payer: Medicare PPO | Admitting: Urology

## 2023-05-26 VITALS — BP 137/79 | HR 73 | Ht 70.0 in | Wt 218.0 lb

## 2023-05-26 DIAGNOSIS — Z125 Encounter for screening for malignant neoplasm of prostate: Secondary | ICD-10-CM

## 2023-05-26 DIAGNOSIS — R972 Elevated prostate specific antigen [PSA]: Secondary | ICD-10-CM

## 2023-05-26 NOTE — Progress Notes (Signed)
   05/26/2023 9:28 AM   Christopher Foster [birth year] [id number]  Reason for visit: Follow up PSA screening  HPI: Healthy 73 year old male with slow rise in his PSA over the last 5 years including 3.9 in 2020,3.78 in August 2022, 4.31 March 2022, and 6.7(15% free) in March 2024.  At our visit on 08/04/2022 he opted for repeat PSA in 3 months, and this decreased to 6.2(14.4% free).  DRE has been benign.  He had a CT scan for lower abdominal pain in July 2024 which showed a 60 g prostate, with reassuring PSA density of 0.1, and he opted to defer prostate biopsy or prostate MRI.  Most recent PSA decreased to 4.5 with very reassuring 22% free.  Reassurance was provided regarding his decreasing PSA trend, reassuring percentage free, low PSA density, normal CT scan.  We reviewed the guidelines that do not recommend routine screening in men over age 12, and he was in agreement to discontinue PSA screening.  Discontinue PSA screening per the guideline recommendations Follow-up with urology as needed  Sondra Come, MD  [facility] [address] [place], [place] [address] [phone]

## 2023-05-26 NOTE — Patient Instructions (Signed)
Prostate Cancer Screening  Prostate cancer screening is testing that is done to check for the presence of prostate cancer in men. The prostate gland is a walnut-sized gland that is located below the bladder and in front of the rectum in males. The function of the prostate is to add fluid to semen during ejaculation. Prostate cancer is one of the most common types of cancer in men. Who should have prostate cancer screening? Screening recommendations vary based on age and other risk factors, as well as between the professional organizations who make the recommendations. In general, screening is recommended if: You are age 75 to 100 and have an average risk for prostate cancer. You should talk with your health care provider about your need for screening and how often screening should be done. Because most prostate cancers are slow growing and will not cause death, screening in this age group is generally reserved for men who have a 10- to 15-year life expectancy. You are younger than age 75, and you have these risk factors: Having a father, brother, or uncle who has been diagnosed with prostate cancer. The risk is higher if your family member's cancer occurred at an early age or if you have multiple family members with prostate cancer at an early age. Being a male who is [place] or is of [place] or sub-Saharan African descent. In general, screening is not recommended if: You are younger than age 75. You are between the ages of 36 and 45 and you have no risk factors. You are 39 years of age or older. At this age, the risks that screening can cause are greater than the benefits that it may provide. If you are at high risk for prostate cancer, your health care provider may recommend that you have screenings more often or that you start screening at a younger age. How is screening for prostate cancer done? The recommended prostate cancer screening test is a blood test called the prostate-specific antigen  (PSA) test. PSA is a protein that is made in the prostate. As you age, your prostate naturally produces more PSA. Abnormally high PSA levels may be caused by: Prostate cancer. An enlarged prostate that is not caused by cancer (benign prostatic hyperplasia, or BPH). This condition is very common in older men. A prostate gland infection (prostatitis) or urinary tract infection. Certain medicines such as male hormones (like testosterone) or other medicines that raise testosterone levels. A rectal exam may be done as part of prostate cancer screening to help provide information about the size of your prostate gland. When a rectal exam is performed, it should be done after the PSA level is drawn to avoid any effect on the results. Depending on the PSA results, you may need more tests, such as:  Blood and imaging tests. A procedure to remove tissue samples from your prostate gland for testing (biopsy). This is the only way to know for certain if you have prostate cancer. What are the benefits of prostate cancer screening? Screening can help to identify cancer at an early stage, before symptoms start and when the cancer can be treated more easily. There is a small chance that screening may lower your risk of dying from prostate cancer. The chance is small because prostate cancer is a slow-growing cancer, and most men with prostate cancer die from a different cause. What are the risks of prostate cancer screening? The main risk of prostate cancer screening is diagnosing and treating prostate cancer that would never have caused  any symptoms or problems. This is called overdiagnosisand overtreatment. PSA screening cannot tell you if your PSA is high due to cancer or a different cause. A prostate biopsy is the only procedure to diagnose prostate cancer. Even the results of a biopsy may not tell you if your cancer needs to be treated. Slow-growing prostate cancer may not need any treatment other than monitoring,  so diagnosing and treating it may cause unnecessary stress or other side effects. Questions to ask your health care provider When should I start prostate cancer screening? What is my risk for prostate cancer? How often do I need screening? What type of screening tests do I need? How do I get my test results? What do my results mean? Do I need treatment? Where to find more information The American Cancer Society: www.cancer.org American Urological Association: www.auanet.org Contact a health care provider if: You have difficulty urinating. You have pain when you urinate or ejaculate. You have blood in your urine or semen. You have pain in your back or in the area of your prostate. Summary Prostate cancer is a common type of cancer in men. The prostate gland is located below the bladder and in front of the rectum. This gland adds fluid to semen during ejaculation. Prostate cancer screening may identify cancer at an early stage, when the cancer can be treated more easily and is less likely to have spread to other areas of the body. The prostate-specific antigen (PSA) test is the recommended screening test for prostate cancer, but it has associated risks. Discuss the risks and benefits of prostate cancer screening with your health care provider. If you are age 75 or older, the risks that screening can cause are greater than the benefits that it may provide. This information is not intended to replace advice given to you by your health care provider. Make sure you discuss any questions you have with your health care provider. Document Revised: 10/06/2020 Document Reviewed: 10/06/2020 Elsevier Patient Education  2024 [employer].

## 2023-06-06 NOTE — Progress Notes (Signed)
   Cardiology Clinic Note   Date: 06/08/2023 ID: Moua, Rasmusson [birth year], MRN [id number]  Primary Cardiologist:  Lorine Bears, MD  Chief Complaint   Christopher Foster is a 73 y.o. male who presents to the clinic today for routine annual follow up.   Patient Profile   Christopher Foster is followed by Dr. Kirke Corin for the history outlined below.      Past medical history significant for: Elevated calcium score.  CT calcium scoring 04/16/2022: Calcium score 101 (38th percentile). CAC in LAD, LCx and RCA. Hyperlipidemia.  Lipid panel 01/05/2023: LDL 77, HDL 49, TG 120, total 150. GERD.  Thyroid cancer.  S/p thyroidectomy.  Prediabetes.   In summary, patient was first evaluated by Dr. Kirke Corin on 06/03/2022 for elevated coronary calcium score (detailed above) as requested by Dr. Burnadette Pop. Patient denies family history of premature CAD. He denied cardiac symptoms and was physically active with no exertional symptoms. No further cardiac testing was indicated and patient was counseled on risk reduction through physical activity and cholesterol control.      History of Present Illness    Today, patient is doing well. Patient denies shortness of breath, dyspnea on exertion, lower extremity edema, orthopnea or PND. No chest pain, pressure, or tightness. No palpitations.  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 He also walks 2 miles 3-4 days a week weather permitting and enjoys golfing. He follows a Mediterranean diet.     ROS: All other systems reviewed and are otherwise negative except as noted in History of Present Illness.  EKGs/Labs Reviewed    EKG Interpretation Date/Time:  Wednesday June 08 2023 08:51:08 EST Ventricular Rate:  65 PR Interval:  166 QRS Duration:  80 QT Interval:  396 QTC Calculation: 411 R Axis:   38  Text Interpretation: Normal sinus rhythm Normal ECG When compared with ECG of 06/03/2022 (not in [facility]) No significant change was found Confirmed  by Carlos Levering [phone]) on 06/08/2023 9:01:14 AM   01/05/2023: A1c 6 Sodium 141, potassium 4.5, creatinine 0.9, BUN 28, eGFR 90.    Physical Exam    VS:  BP 128/62   Pulse 65   Ht 5\' 10"  (1.778 m)   Wt 229 lb 12.8 oz (104.2 kg)   SpO2 96%   BMI 32.97 kg/m  , BMI Body mass index is 32.97 kg/m.  GEN: Well nourished, well developed, in no acute distress. Neck: No JVD or carotid bruits. Cardiac:  RRR. No murmurs. No rubs or gallops.   Respiratory:  Respirations regular and unlabored. Clear to auscultation without rales, wheezing or rhonchi. GI: Soft, nontender, nondistended. Extremities: Radials/DP/PT 2+ and equal bilaterally. No clubbing or cyanosis. No edema.  Skin: Warm and dry, no rash. Neuro: Strength intact.  Assessment & Plan   Elevated calcium score CT calcium scoring December 2023 101. Patient  denies chest pain, pressure, or tightness. He is active working as a Psychologist, sport and exercise, walking 2  miles 3-4 days a week and golfing weather permitting. He follows a Mediterranean diet. EKG today shows NSR.  -Continue aspirin and rosuvastatin.  -Continue physical activity.  Hyperlipidemia LDL September 2024 77. He follows a Mediterranean diet. Discussed portion control.  -Continue rosuvastatin.  -Continue to follow with PCP.   Disposition: Return in 1  year or sooner as needed.          Signed, Etta Grandchild. Kaveh Kissinger, DNP, NP-C

## 2023-06-08 ENCOUNTER — Ambulatory Visit: Payer: Medicare PPO | Attending: Student | Admitting: Student

## 2023-06-08 ENCOUNTER — Encounter: Payer: Self-pay | Admitting: Student

## 2023-06-08 VITALS — BP 128/62 | HR 65 | Ht 70.0 in | Wt 229.8 lb

## 2023-06-08 DIAGNOSIS — R931 Abnormal findings on diagnostic imaging of heart and coronary circulation: Secondary | ICD-10-CM

## 2023-06-08 DIAGNOSIS — E785 Hyperlipidemia, unspecified: Secondary | ICD-10-CM

## 2023-06-08 NOTE — Patient Instructions (Signed)
Medication Instructions:   Your physician recommends that you continue on your current medications as directed. Please refer to the Current Medication list given to you today.  *If you need a refill on your cardiac medications before your next appointment, please call your pharmacy*   Lab Work:  NONE ORDERED TODAY  If you have labs (blood work) drawn today and your tests are completely normal, you will receive your results only by: MyChart Message (if you have MyChart) OR A paper copy in the mail If you have any lab test that is abnormal or we need to change your treatment, we will call you to review the results.   Testing/Procedures:  NONE ORDERED TODAY   Follow-Up: At [facility], you and your health needs are our priority.  As part of our continuing mission to provide you with exceptional heart care, we have created designated Provider Care Teams.  These Care Teams include your primary Cardiologist (physician) and Advanced Practice Providers (APPs -  Physician Assistants and Nurse Practitioners) who all work together to provide you with the care you need, when you need it.  We recommend signing up for the patient portal called "MyChart".  Sign up information is provided on this After Visit Summary.  MyChart is used to connect with patients for Virtual Visits (Telemedicine).  Patients are able to view lab/test results, encounter notes, upcoming appointments, etc.  Non-urgent messages can be sent to your provider as well.   To learn more about what you can do with MyChart, go to [email or web address].    Your next appointment:   12 month(s)  Provider:   You may see Lorine Bears, MD or one of the following Advanced Practice Providers on your designated Care Team:   Nicolasa Ducking, NP Eula Listen, PA-C Cadence Fransico Michael, PA-C Charlsie Quest, NP Carlos Levering, NP

## 2024-06-07 ENCOUNTER — Ambulatory Visit: Admitting: Cardiovascular Disease
